# Patient Record
Sex: Female | Born: 1985 | Hispanic: Yes | Marital: Single | Smoking: Never smoker
Health system: Southern US, Community
[De-identification: ages and names within clinical notes are randomized; demographics above are authoritative.]

## PROBLEM LIST (undated history)

## (undated) DIAGNOSIS — Z9049 Acquired absence of other specified parts of digestive tract: Secondary | ICD-10-CM

## (undated) HISTORY — PX: DILATION AND CURETTAGE OF UTERUS: SHX78

---

## 2005-01-15 ENCOUNTER — Inpatient Hospital Stay (HOSPITAL_COMMUNITY): Admission: AD | Admit: 2005-01-15 | Discharge: 2005-01-17 | Payer: Self-pay | Admitting: Obstetrics & Gynecology

## 2005-02-06 ENCOUNTER — Inpatient Hospital Stay (HOSPITAL_COMMUNITY): Admission: AD | Admit: 2005-02-06 | Discharge: 2005-02-06 | Payer: Self-pay | Admitting: Obstetrics & Gynecology

## 2006-10-14 ENCOUNTER — Other Ambulatory Visit: Admission: RE | Admit: 2006-10-14 | Discharge: 2006-10-14 | Payer: Self-pay | Admitting: Gynecology

## 2006-11-19 ENCOUNTER — Inpatient Hospital Stay (HOSPITAL_COMMUNITY): Admission: AD | Admit: 2006-11-19 | Discharge: 2006-11-20 | Payer: Self-pay | Admitting: Obstetrics and Gynecology

## 2006-11-20 ENCOUNTER — Encounter (INDEPENDENT_AMBULATORY_CARE_PROVIDER_SITE_OTHER): Payer: Self-pay | Admitting: Obstetrics and Gynecology

## 2007-08-03 ENCOUNTER — Emergency Department (HOSPITAL_COMMUNITY): Admission: EM | Admit: 2007-08-03 | Discharge: 2007-08-03 | Payer: Self-pay | Admitting: Emergency Medicine

## 2008-08-05 ENCOUNTER — Inpatient Hospital Stay (HOSPITAL_COMMUNITY): Admission: AD | Admit: 2008-08-05 | Discharge: 2008-08-05 | Payer: Self-pay | Admitting: Obstetrics and Gynecology

## 2008-10-18 ENCOUNTER — Inpatient Hospital Stay (HOSPITAL_COMMUNITY): Admission: AD | Admit: 2008-10-18 | Discharge: 2008-10-18 | Payer: Self-pay | Admitting: Obstetrics & Gynecology

## 2009-02-28 ENCOUNTER — Inpatient Hospital Stay (HOSPITAL_COMMUNITY): Admission: AD | Admit: 2009-02-28 | Discharge: 2009-03-02 | Payer: Self-pay | Admitting: Obstetrics & Gynecology

## 2009-02-28 ENCOUNTER — Encounter: Payer: Self-pay | Admitting: Obstetrics & Gynecology

## 2010-01-24 ENCOUNTER — Emergency Department (HOSPITAL_COMMUNITY): Admission: EM | Admit: 2010-01-24 | Discharge: 2010-01-24 | Payer: Self-pay | Admitting: Emergency Medicine

## 2010-01-30 ENCOUNTER — Emergency Department (HOSPITAL_COMMUNITY): Admission: EM | Admit: 2010-01-30 | Discharge: 2010-01-31 | Payer: Self-pay | Admitting: Emergency Medicine

## 2010-02-22 ENCOUNTER — Ambulatory Visit: Payer: Self-pay | Admitting: Nurse Practitioner

## 2010-02-22 ENCOUNTER — Inpatient Hospital Stay (HOSPITAL_COMMUNITY): Admission: AD | Admit: 2010-02-22 | Discharge: 2010-02-23 | Payer: Self-pay | Admitting: Family Medicine

## 2010-03-20 ENCOUNTER — Emergency Department (HOSPITAL_COMMUNITY): Admission: EM | Admit: 2010-03-20 | Discharge: 2010-01-03 | Payer: Self-pay | Admitting: Emergency Medicine

## 2010-04-12 ENCOUNTER — Ambulatory Visit (HOSPITAL_COMMUNITY)
Admission: AD | Admit: 2010-04-12 | Discharge: 2010-04-12 | Payer: Self-pay | Source: Home / Self Care | Attending: Obstetrics & Gynecology | Admitting: Obstetrics & Gynecology

## 2010-05-07 ENCOUNTER — Ambulatory Visit
Admission: RE | Admit: 2010-05-07 | Discharge: 2010-05-07 | Payer: Self-pay | Source: Home / Self Care | Attending: Obstetrics & Gynecology | Admitting: Obstetrics & Gynecology

## 2010-05-08 NOTE — Progress Notes (Unsigned)
NAME:  Miranda Sosa, Miranda Sosa NO.:  [id number]  MEDICAL RECORD NO.:  [id number]          PATIENT TYPE:  WOC  LOCATION:  [facility]                   FACILITY:  [facility]  PHYSICIAN:  Elsie Lincoln, MD      DATE OF BIRTH:  [birth year]  DATE OF SERVICE:                                 CLINIC NOTE  The patient is a 25 year old female who presents approximately 3 weeks to a month after a 10-week D and E.  The patient had presented to the MAU with a 10-week 2-day missed abortion.  The patient had an uncomplicated D and E.  Pathology reveals products of conception.  The patient is not having any problems.  The patient is having bleeding. She would like Depo-Provera.  She is not having sexual intercourse.  [facility].  She gets Pap smears there and she will return there as well for all of her health care maintenance.  PHYSICAL EXAMINATION:  VITAL SIGNS:  Temperature 97.5, pulse 82, blood pressure 107/77, weight 133.9. GENERAL:  Well nourished, well developed, no apparent stress. HEENT:  Normocephalic, atraumatic. CHEST:  Normal movement. ABDOMEN:  Soft, nontender. PELVIC:  Genitalia, Tanner V.  Vagina pink, normal rugae.  Cervix closed, nontender.  Uterus retroverted, nontender.  Adnexa; no masses, nontender.  Both ovaries felt. EXTREMITIES:  No edema.  ASSESSMENT AND PLAN:  A 25 year old female status post D and E for 10- week missed abortion. 1. The patient is doing well, exam normal. 2. Depo-Provera. 3. [facility].          ______________________________ Elsie Lincoln, MD    KL/MEDQ  D:  05/07/2010  T:  05/08/2010  Job:  [id number]

## 2010-05-14 DEATH — deceased

## 2010-06-23 LAB — CBC
HCT: 36.5 % (ref 36.0–46.0)
Hemoglobin: 12.5 g/dL (ref 12.0–15.0)
MCV: 85.1 fL (ref 78.0–100.0)
RBC: 4.29 MIL/uL (ref 3.87–5.11)
WBC: 8.4 10*3/uL (ref 4.0–10.5)

## 2010-06-24 LAB — COMPREHENSIVE METABOLIC PANEL
ALT: 33 U/L (ref 0–35)
AST: 20 U/L (ref 0–37)
Alkaline Phosphatase: 64 U/L (ref 39–117)
CO2: 23 mEq/L (ref 19–32)
Calcium: 8.8 mg/dL (ref 8.4–10.5)
Chloride: 104 mEq/L (ref 96–112)
GFR calc Af Amer: >60 mL/min (ref >60)
GFR calc non Af Amer: >60 mL/min (ref >60)
Glucose, Bld: 96 mg/dL (ref 70–99)
Sodium: 136 mEq/L (ref 135–145)
Total Bilirubin: 0.2 mg/dL — ABNORMAL LOW (ref 0.3–1.2)

## 2010-06-24 LAB — URINALYSIS, ROUTINE W REFLEX MICROSCOPIC
Bilirubin Urine: NEGATIVE
Nitrite: NEGATIVE
Specific Gravity, Urine: 1.03 (ref 1.005–1.030)
pH: 6 (ref 5.0–8.0)

## 2010-06-24 LAB — URINE MICROSCOPIC-ADD ON

## 2010-06-24 LAB — GC/CHLAMYDIA PROBE AMP, GENITAL
Chlamydia, DNA Probe: NEGATIVE
GC Probe Amp, Genital: NEGATIVE

## 2010-06-24 LAB — HCG, QUANTITATIVE, PREGNANCY: hCG, Beta Chain, Quant, S: 45202 m[IU]/mL — ABNORMAL HIGH (ref <5)

## 2010-06-24 LAB — URINE CULTURE

## 2010-06-24 LAB — CBC
HCT: 34.7 % — ABNORMAL LOW (ref 36.0–46.0)
Hemoglobin: 11.8 g/dL — ABNORMAL LOW (ref 12.0–15.0)
MCHC: 34 g/dL (ref 30.0–36.0)
RBC: 3.92 MIL/uL (ref 3.87–5.11)

## 2010-06-24 LAB — WET PREP, GENITAL

## 2010-06-25 LAB — URINALYSIS, ROUTINE W REFLEX MICROSCOPIC
Ketones, ur: NEGATIVE mg/dL
Nitrite: NEGATIVE
Protein, ur: NEGATIVE mg/dL
Urobilinogen, UA: 1 mg/dL (ref 0.0–1.0)
pH: 6 (ref 5.0–8.0)

## 2010-06-25 LAB — DIFFERENTIAL
Basophils Absolute: 0 10*3/uL (ref 0.0–0.1)
Eosinophils Absolute: 0.1 10*3/uL (ref 0.0–0.7)
Eosinophils Relative: 2 % (ref 0–5)
Lymphocytes Relative: 27 % (ref 12–46)

## 2010-06-25 LAB — COMPREHENSIVE METABOLIC PANEL
AST: 37 U/L (ref 0–37)
CO2: 23 mEq/L (ref 19–32)
Chloride: 109 mEq/L (ref 96–112)
Creatinine, Ser: 0.6 mg/dL (ref 0.4–1.2)
GFR calc Af Amer: >60 mL/min (ref >60)
GFR calc non Af Amer: >60 mL/min (ref >60)
Total Bilirubin: 0.5 mg/dL (ref 0.3–1.2)

## 2010-06-25 LAB — CBC
HCT: 38.2 % (ref 36.0–46.0)
Hemoglobin: 12.9 g/dL (ref 12.0–15.0)
MCHC: 33.8 g/dL (ref 30.0–36.0)
RBC: 4.5 MIL/uL (ref 3.87–5.11)
WBC: 8.2 10*3/uL (ref 4.0–10.5)

## 2010-06-25 LAB — POCT I-STAT, CHEM 8
HCT: 40 % (ref 36.0–46.0)
Hemoglobin: 13.6 g/dL (ref 12.0–15.0)
Potassium: 4 mEq/L (ref 3.5–5.1)
Sodium: 140 mEq/L (ref 135–145)

## 2010-06-25 LAB — URINE CULTURE: Colony Count: 8000

## 2010-06-26 ENCOUNTER — Other Ambulatory Visit: Payer: Self-pay | Admitting: Surgery

## 2010-06-26 ENCOUNTER — Encounter (HOSPITAL_COMMUNITY): Payer: Self-pay | Attending: Surgery

## 2010-06-26 DIAGNOSIS — Z01812 Encounter for preprocedural laboratory examination: Secondary | ICD-10-CM | POA: Insufficient documentation

## 2010-06-26 LAB — COMPREHENSIVE METABOLIC PANEL
ALT: 91 U/L — ABNORMAL HIGH (ref 0–35)
ALT: 26 U/L (ref 0–35)
AST: 49 U/L — ABNORMAL HIGH (ref 0–37)
AST: 33 U/L (ref 0–37)
Albumin: 4.7 g/dL (ref 3.5–5.2)
Albumin: 4.4 g/dL (ref 3.5–5.2)
Alkaline Phosphatase: 106 U/L (ref 39–117)
Alkaline Phosphatase: 95 U/L (ref 39–117)
BUN: 16 mg/dL (ref 6–23)
CO2: 27 mEq/L (ref 19–32)
Chloride: 106 mEq/L (ref 96–112)
Chloride: 105 mEq/L (ref 96–112)
Chloride: 109 mEq/L (ref 96–112)
Creatinine, Ser: 0.67 mg/dL (ref 0.4–1.2)
GFR calc Af Amer: >60 mL/min (ref >60)
GFR calc Af Amer: >60 mL/min (ref >60)
GFR calc non Af Amer: >60 mL/min (ref >60)
Glucose, Bld: 99 mg/dL (ref 70–99)
Glucose, Bld: 113 mg/dL — ABNORMAL HIGH (ref 70–99)
Potassium: 3.5 mEq/L (ref 3.5–5.1)
Potassium: 3.7 mEq/L (ref 3.5–5.1)
Potassium: 3.5 mEq/L (ref 3.5–5.1)
Sodium: 140 mEq/L (ref 135–145)
Sodium: 141 mEq/L (ref 135–145)
Total Bilirubin: 0.3 mg/dL (ref 0.3–1.2)
Total Bilirubin: 0.5 mg/dL (ref 0.3–1.2)
Total Bilirubin: 0.3 mg/dL (ref 0.3–1.2)
Total Protein: 7.9 g/dL (ref 6.0–8.3)
Total Protein: 7.3 g/dL (ref 6.0–8.3)

## 2010-06-26 LAB — URINALYSIS, ROUTINE W REFLEX MICROSCOPIC
Bilirubin Urine: NEGATIVE
Bilirubin Urine: NEGATIVE
Glucose, UA: NEGATIVE mg/dL
Hgb urine dipstick: NEGATIVE
Hgb urine dipstick: NEGATIVE
Ketones, ur: NEGATIVE mg/dL
Nitrite: NEGATIVE
Specific Gravity, Urine: 1.027 (ref 1.005–1.030)
Specific Gravity, Urine: 1.025 (ref 1.005–1.030)
Urobilinogen, UA: 1 mg/dL (ref 0.0–1.0)
pH: 6 (ref 5.0–8.0)
pH: 5 (ref 5.0–8.0)

## 2010-06-26 LAB — DIFFERENTIAL
Basophils Absolute: 0 10*3/uL (ref 0.0–0.1)
Basophils Absolute: 0 10*3/uL (ref 0.0–0.1)
Basophils Relative: 0 % (ref 0–1)
Eosinophils Absolute: 0.2 10*3/uL (ref 0.0–0.7)
Eosinophils Relative: 2 % (ref 0–5)
Eosinophils Relative: 2 % (ref 0–5)
Eosinophils Relative: 2 % (ref 0–5)
Lymphocytes Relative: 46 % (ref 12–46)
Lymphocytes Relative: 37 % (ref 12–46)
Lymphs Abs: 3.1 10*3/uL (ref 0.7–4.0)
Lymphs Abs: 4.6 10*3/uL — ABNORMAL HIGH (ref 0.7–4.0)
Monocytes Absolute: 0.6 10*3/uL (ref 0.1–1.0)
Monocytes Absolute: 0.6 10*3/uL (ref 0.1–1.0)
Monocytes Relative: 6 % (ref 3–12)
Neutro Abs: 4.6 10*3/uL (ref 1.7–7.7)
Neutrophils Relative %: 52 % (ref 43–77)

## 2010-06-26 LAB — CBC
HCT: 35.4 % — ABNORMAL LOW (ref 36.0–46.0)
HCT: 35.5 % — ABNORMAL LOW (ref 36.0–46.0)
Hemoglobin: 11.6 g/dL — ABNORMAL LOW (ref 12.0–15.0)
Hemoglobin: 11.7 g/dL — ABNORMAL LOW (ref 12.0–15.0)
MCH: 28.5 pg (ref 26.0–34.0)
MCV: 89.4 fL (ref 78.0–100.0)
Platelets: 273 10*3/uL (ref 150–400)
RBC: 4.49 MIL/uL (ref 3.87–5.11)
RBC: 3.96 MIL/uL (ref 3.87–5.11)
RBC: 4.18 MIL/uL (ref 3.87–5.11)
WBC: 10.1 10*3/uL (ref 4.0–10.5)
WBC: 8.4 10*3/uL (ref 4.0–10.5)

## 2010-06-26 LAB — SURGICAL PCR SCREEN
MRSA, PCR: NEGATIVE
Staphylococcus aureus: NEGATIVE

## 2010-06-26 LAB — URINE MICROSCOPIC-ADD ON

## 2010-06-26 LAB — LIPASE, BLOOD: Lipase: 25 U/L (ref 11–59)

## 2010-06-26 LAB — POCT PREGNANCY, URINE: Preg Test, Ur: NEGATIVE

## 2010-06-30 ENCOUNTER — Other Ambulatory Visit: Payer: Self-pay | Admitting: Surgery

## 2010-06-30 ENCOUNTER — Ambulatory Visit (HOSPITAL_COMMUNITY)
Admission: RE | Admit: 2010-06-30 | Discharge: 2010-06-30 | Disposition: A | Discharge status: Home / Self Care | Payer: Self-pay | Source: Ambulatory Visit | Attending: Surgery | Admitting: Surgery

## 2010-06-30 ENCOUNTER — Other Ambulatory Visit (HOSPITAL_COMMUNITY): Payer: Self-pay | Admitting: Surgery

## 2010-06-30 DIAGNOSIS — K8010 Calculus of gallbladder with chronic cholecystitis without obstruction: Secondary | ICD-10-CM | POA: Insufficient documentation

## 2010-06-30 DIAGNOSIS — K8020 Calculus of gallbladder without cholecystitis without obstruction: Secondary | ICD-10-CM

## 2010-07-01 NOTE — Op Note (Signed)
Miranda Sosa, Miranda Sosa       ACCOUNT NO.:  [id number]  MEDICAL RECORD NO.:  [id number]           PATIENT TYPE:  O  LOCATION:  XRAY                         FACILITY:  [facility]  PHYSICIAN:  Wilmon Arms. Corliss Skains, M.D. DATE OF BIRTH:  02-28-1986  DATE OF PROCEDURE:  06/30/2010 DATE OF DISCHARGE:                              OPERATIVE REPORT   PREOPERATIVE DIAGNOSIS:  Chronic calculus cholecystitis.  POSTOPERATIVE DIAGNOSIS:  Chronic calculus cholecystitis.  PROCEDURE PERFORMED:  Laparoscopic cholecystectomy with intraoperative cholangiogram.  SURGEON:  Wilmon Arms. Ilya Neely, M.D.  ANESTHESIA:  General.  INDICATIONS:  This is a 24 year old female who was diagnosed with symptomatic gallstones during her pregnancy last year.  Unfortunately, she suffered a miscarriage and underwent D and E in December.  Her symptoms have persisted and she presents now for cholecystectomy.  An ultrasound has confirmed gallstones.  Liver function tests were normal. She presents now for cholecystectomy.  DESCRIPTION OF PROCEDURE:  The patient was brought to the operating room, placed in supine position on the operating room table.  After adequate level of general anesthesia was obtained, the patient's abdomen was prepped with ChloraPrep and draped in sterile fashion.  A time-out was taken to assure the proper patient, proper procedure.  We infiltrated the area below the umbilicus with 0.25% Marcaine with epinephrine.  A transverse incision was made.  Dissection was carried down the fascia.  The fascia was opened vertically.  We entered into the peritoneal cavity bluntly.  A stay suture of 0 Vicryl was placed around the fascial opening.  A Hasson cannula was inserted and secured with stay suture.  Pneumoperitoneum was obtained by insufflating CO2 and maintaining maximal pressure of 15 mmHg.  The laparoscope was inserted and the patient was positioned reverse Trendelenburg to her left.  An 11 mm port was  placed in the subxiphoid position.  Two 5 mm ports were placed in the right upper quadrant.  The gallbladder was grasped with a clamp and lifted.  There were minimal adhesions to the gallbladder.  We opened the peritoneum around the hilum of the gallbladder.  We dissected around the cystic artery and cystic duct.  The cystic artery was quite large and was anterior to the cystic duct.  We ligated the cystic artery with the clips and divided.  The cystic duct was ligated and clipped distally.  A small opening was created on cystic duct.  A Cook cholangiogram catheter was inserted through the stab incision, threaded in the cystic duct.  Cholangiogram was then obtained, which showed good flow proximally and distally in biliary tree with no sign of filling defect.  Contrast flowed easily into duodenum.  The catheter was removed.  The cystic duct was ligated with clips and divided. Electrocautery was used to dissect the gallbladder free from the liver. The gallbladder was placed in EndoCatch sac.  We irrigated the gallbladder fossa and inspected for hemostasis.  The gallbladder and EndoCatch sac were removed through umbilical port site.  Pursestring sutures were used to close the umbilicus.  We inspected carefully for hemostasis.  Pneumoperitoneum was then released as we removed the trocars.  A 4-0 Monocryl was used to close skin incisions.  Steri-Strips and clean dressings were applied.  The patient was extubated and brought to recovery room in stable condition.  All sponge, instrument, needle counts were correct.     Wilmon Arms. Corliss Skains, M.D.     MKT/MEDQ  D:  06/30/2010  T:  07/01/2010  Job:  [id number]  Electronically Signed by Manus Rudd M.D. on 07/01/2010 10:36:54 AM

## 2010-07-16 LAB — CBC
Hemoglobin: 11.3 g/dL — ABNORMAL LOW (ref 12.0–15.0)
MCHC: 33.5 g/dL (ref 30.0–36.0)
Platelets: 142 10*3/uL — ABNORMAL LOW (ref 150–400)
Platelets: 175 10*3/uL (ref 150–400)
RBC: 3.42 MIL/uL — ABNORMAL LOW (ref 3.87–5.11)
RDW: 14.6 % (ref 11.5–15.5)
WBC: 8.2 10*3/uL (ref 4.0–10.5)

## 2010-07-16 LAB — RPR: RPR Ser Ql: NONREACTIVE

## 2010-07-20 LAB — CBC
Hemoglobin: 10.5 g/dL — ABNORMAL LOW (ref 12.0–15.0)
RBC: 3.31 MIL/uL — ABNORMAL LOW (ref 3.87–5.11)
RDW: 14.3 % (ref 11.5–15.5)
WBC: 9.3 10*3/uL (ref 4.0–10.5)

## 2010-07-20 LAB — WET PREP, GENITAL: Clue Cells Wet Prep HPF POC: NONE SEEN

## 2010-07-20 LAB — COMPREHENSIVE METABOLIC PANEL
ALT: 15 U/L (ref 0–35)
Alkaline Phosphatase: 58 U/L (ref 39–117)
Chloride: 106 mEq/L (ref 96–112)
Glucose, Bld: 87 mg/dL (ref 70–99)
Potassium: 3.5 mEq/L (ref 3.5–5.1)
Sodium: 136 mEq/L (ref 135–145)
Total Bilirubin: 0.3 mg/dL (ref 0.3–1.2)
Total Protein: 6 g/dL (ref 6.0–8.3)

## 2010-07-20 LAB — URINALYSIS, ROUTINE W REFLEX MICROSCOPIC
Glucose, UA: NEGATIVE mg/dL
Ketones, ur: NEGATIVE mg/dL
Nitrite: NEGATIVE
Specific Gravity, Urine: 1.025 (ref 1.005–1.030)
pH: 6.5 (ref 5.0–8.0)

## 2010-07-20 LAB — GC/CHLAMYDIA PROBE AMP, GENITAL: GC Probe Amp, Genital: NEGATIVE

## 2010-07-23 LAB — URINALYSIS, ROUTINE W REFLEX MICROSCOPIC
Nitrite: NEGATIVE
Specific Gravity, Urine: 1.01 (ref 1.005–1.030)
pH: 6.5 (ref 5.0–8.0)

## 2010-07-23 LAB — ABO/RH: ABO/RH(D): O POS

## 2010-07-23 LAB — CBC
Hemoglobin: 11.7 g/dL — ABNORMAL LOW (ref 12.0–15.0)
MCHC: 34.7 g/dL (ref 30.0–36.0)
RBC: 3.74 MIL/uL — ABNORMAL LOW (ref 3.87–5.11)

## 2011-01-26 LAB — GC/CHLAMYDIA PROBE AMP, GENITAL
Chlamydia, DNA Probe: POSITIVE — AB
GC Probe Amp, Genital: NEGATIVE

## 2011-01-26 LAB — SYPHILIS: RPR W/REFLEX TO RPR TITER AND TREPONEMAL ANTIBODIES, TRADITIONAL SCREENING AND DIAGNOSIS ALGORITHM: RPR Ser Ql: NONREACTIVE

## 2011-01-26 LAB — CBC
HCT: 33.2 — ABNORMAL LOW
Hemoglobin: 11.1 — ABNORMAL LOW
MCHC: 33.4
MCV: 87.2
Platelets: 239
RBC: 3.81 — ABNORMAL LOW
RDW: 13.7
WBC: 11.8 — ABNORMAL HIGH

## 2011-01-26 LAB — URINALYSIS, ROUTINE W REFLEX MICROSCOPIC
Bilirubin Urine: NEGATIVE
Glucose, UA: 250 — AB
Ketones, ur: NEGATIVE
Nitrite: NEGATIVE
Protein, ur: NEGATIVE
Specific Gravity, Urine: <1.005 — ABNORMAL LOW
Urobilinogen, UA: 0.2
pH: 7

## 2011-01-26 LAB — HCG, QUANTITATIVE, PREGNANCY

## 2011-01-26 LAB — ABO/RH: ABO/RH(D): O POS

## 2011-01-26 LAB — URINE MICROSCOPIC-ADD ON

## 2012-03-16 IMAGING — US US ABDOMEN COMPLETE
1 series · 13 of 25 positions shown · non-contrast
Comparison: Abdominal ultrasound performed 10/18/2008

CLINICAL DATA: Abdominal pain.

ABDOMINAL ULTRASOUND COMPLETE

[Series 1: us abdomen complete · 0.30mm/px · 13 of 51 slices shown]
[im 1/51]
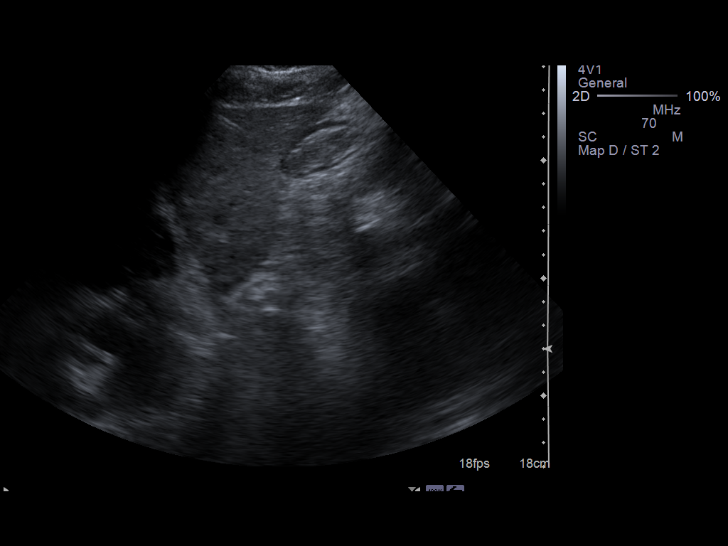
[im 5/51]
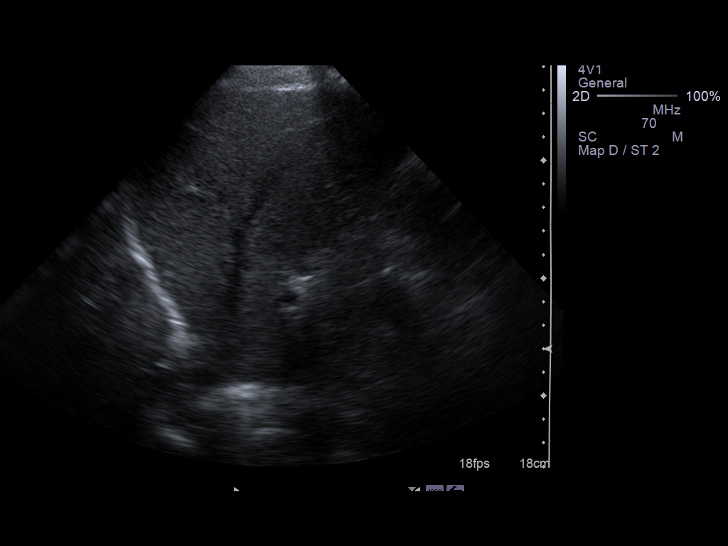
[im 9/51]
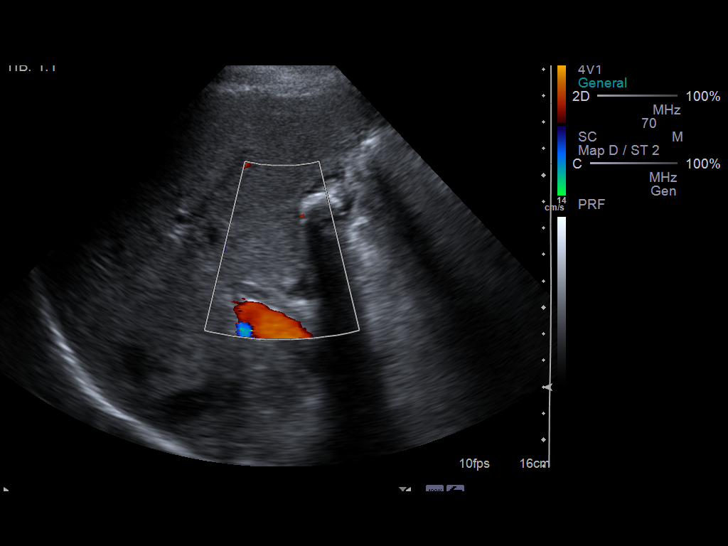
[im 13/51]
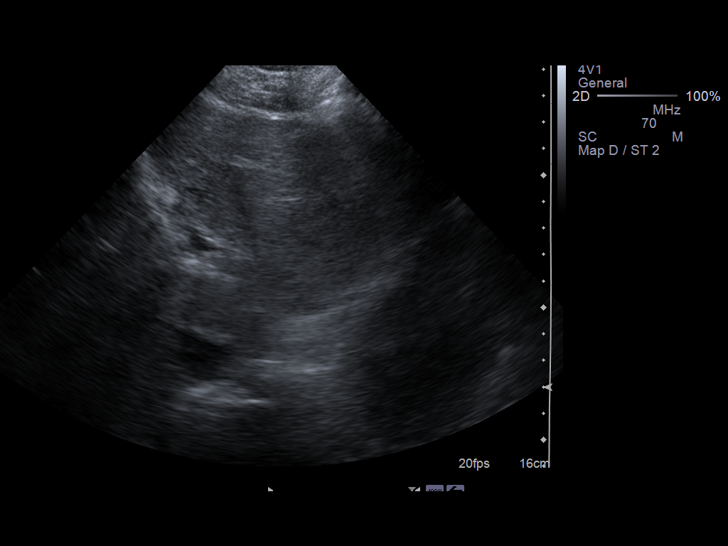
[im 17/51]
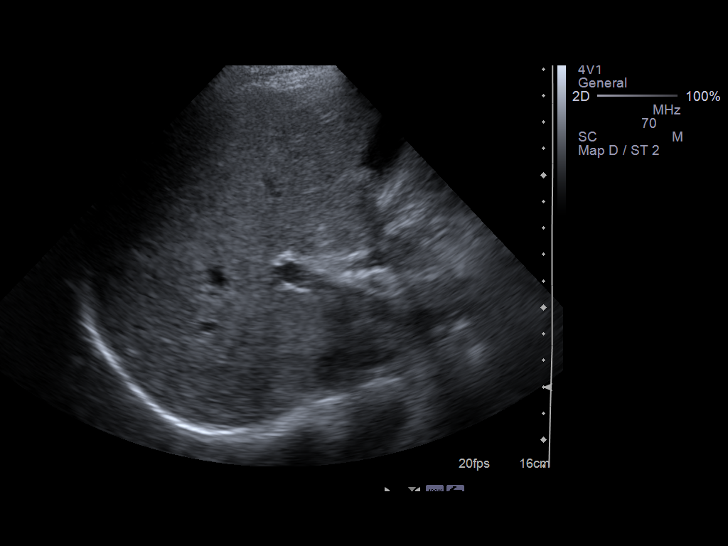
[im 21/51]
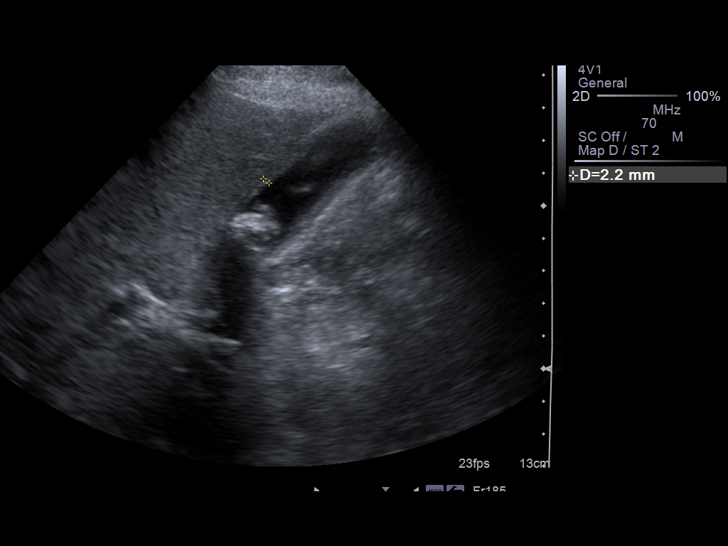
[im 26/51]
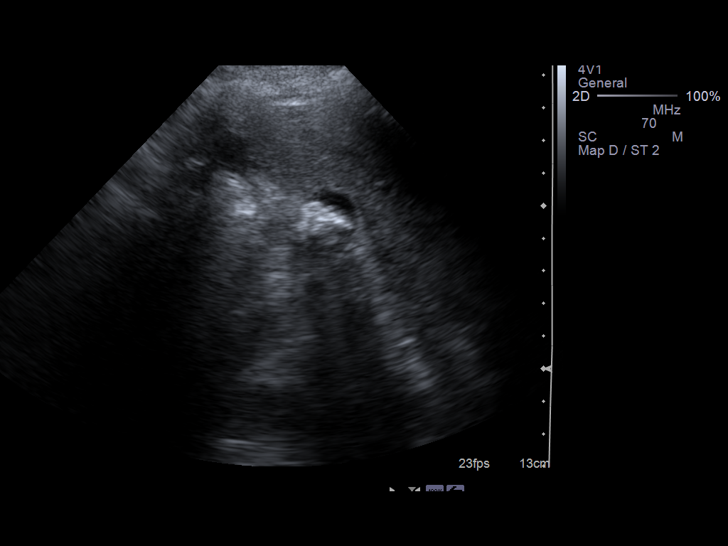
[im 30/51]
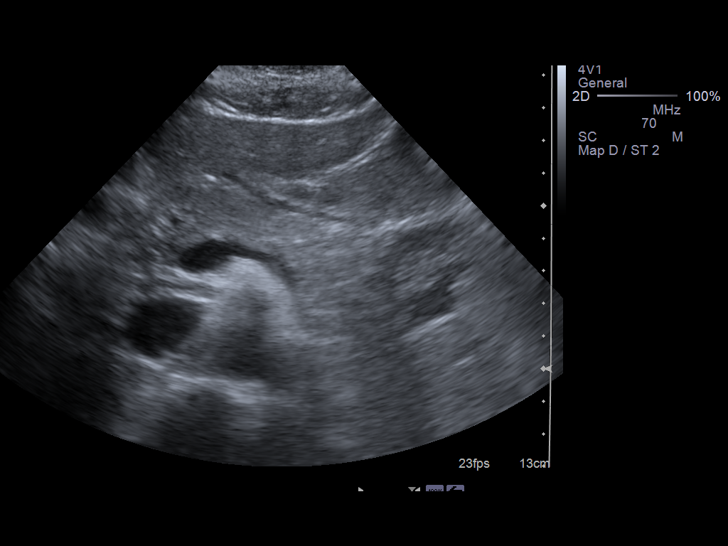
[im 34/51]
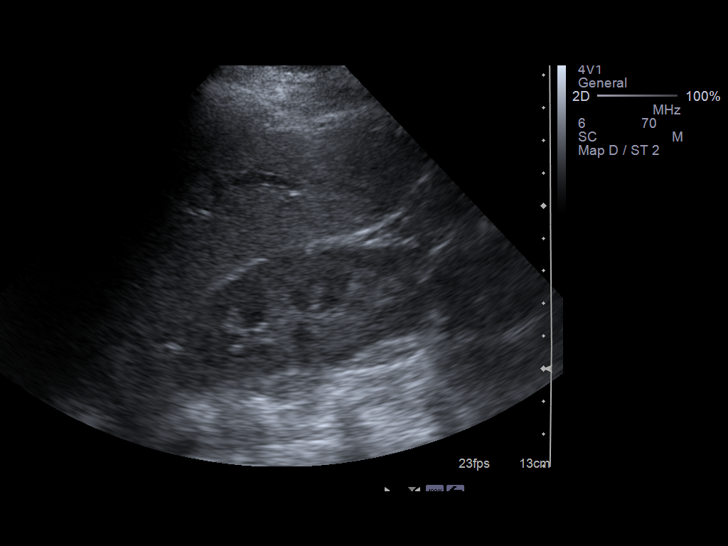
[im 38/51]
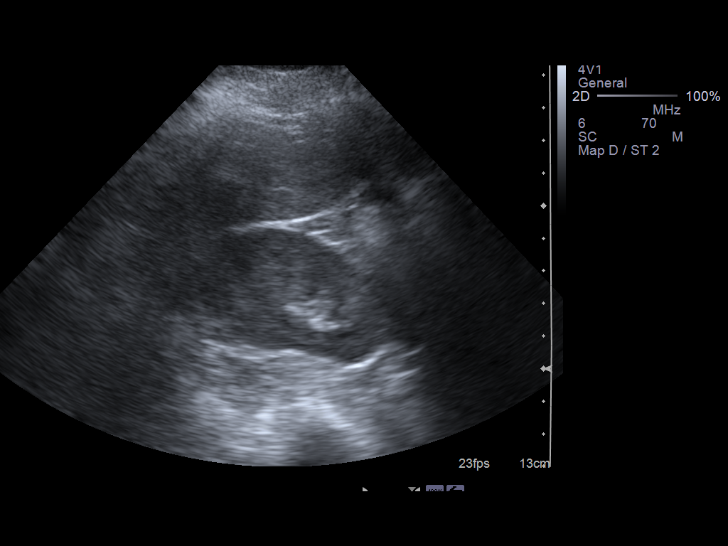
[im 42/51]
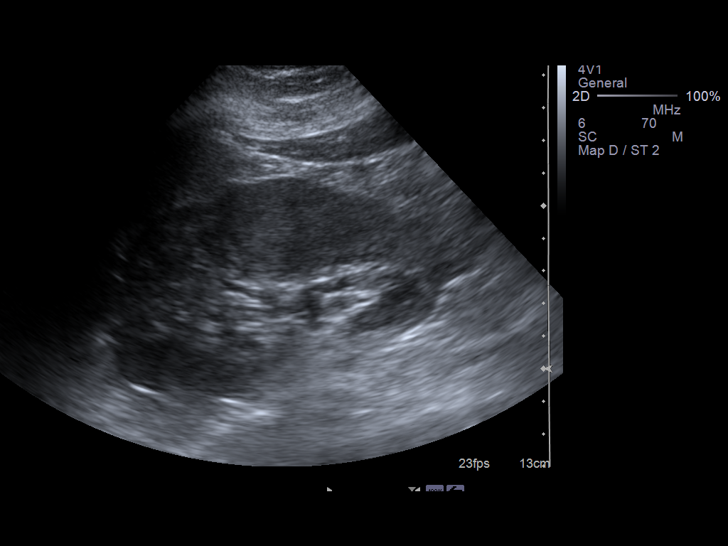
[im 46/51]
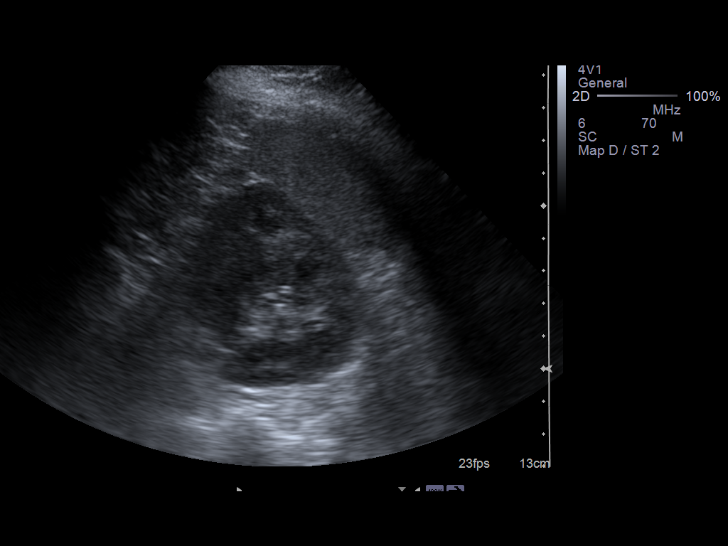
[im 51/51]
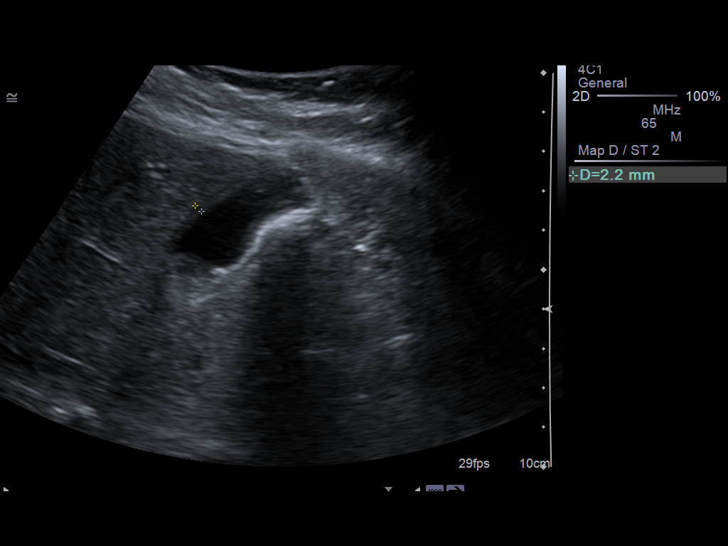

[13 of 25 positions shown; findings below may reference images not displayed]

FINDINGS: Gallbladder:  Multiple stones are seen within the gallbladder, the
largest of which measures 1.9 cm, lodged at the base and neck of
the gallbladder.  This largest stone was also present on the prior
study.  There is no evidence of gallbladder distension to suggest
obstruction; no pericholecystic fluid or gallbladder wall
thickening is seen.  No ultrasonographic Murphy's sign is elicited.

Common Bile Duct:  0.4 cm in diameter; within normal limits in
caliber.

Liver:  Normal parenchymal echogenicity and echotexture; no focal
lesions identified.  Limited Doppler evaluation demonstrates normal
blood flow within the liver.

IVC:  Unremarkable in appearance.

Pancreas:  Although the pancreas is difficult to visualize in its
entirety due to overlying bowel gas, no focal pancreatic
abnormality is identified.

Spleen:  9.6 cm in length; within normal limits in size and
echotexture.

Right kidney:  10.2 cm in length; normal in size, configuration and
parenchymal echogenicity.  No evidence of mass or hydronephrosis.

Left kidney:  11.0 cm in length; normal in size, configuration and
parenchymal echogenicity.  No evidence of mass or hydronephrosis.

Abdominal Aorta:  Normal in caliber; no aneurysm identified.
IMPRESSION: 1.  Cholelithiasis; 1.9 cm stone again seen lodged at the base and
neck of the gallbladder.  No evidence of obstruction or
cholecystitis.
2.  Otherwise unremarkable abdominal ultrasound.

## 2012-06-23 IMAGING — US US OB COMP LESS 14 WK
1 series · 14 of 25 positions shown · non-contrast
Comparison: none

[Series 1: us ob comp less 14 wks · 25 acquisitions, 14 frames shown]
[im 1/25]
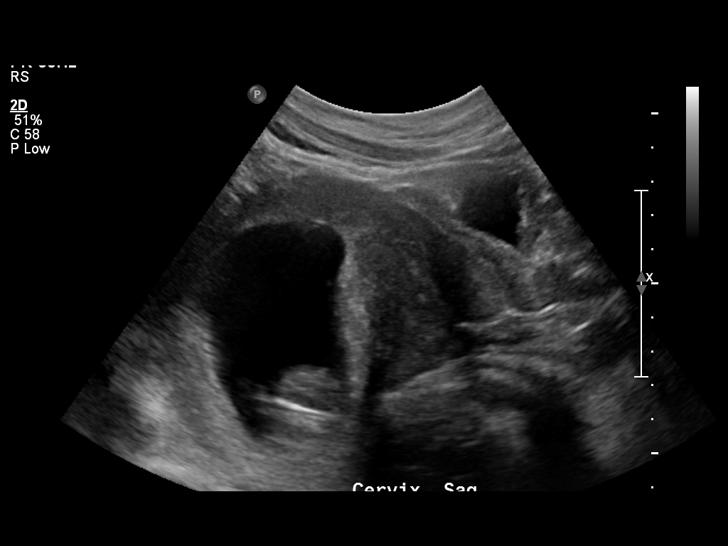
[im 3/25]
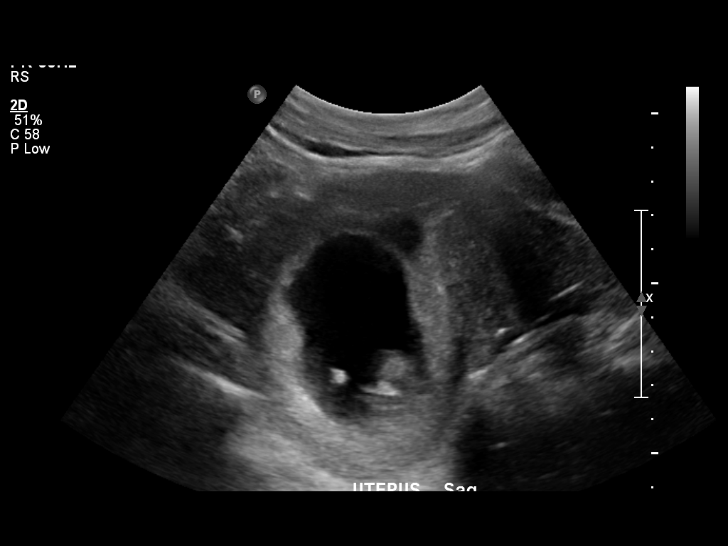
[im 5/25]
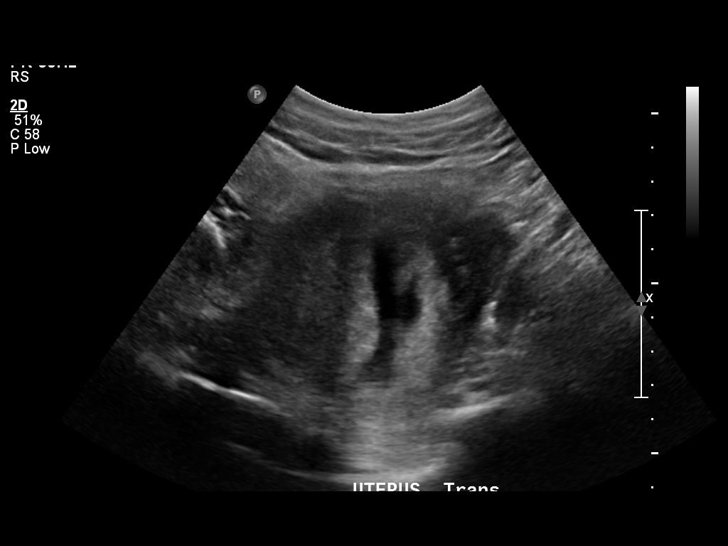
[im 7/25]
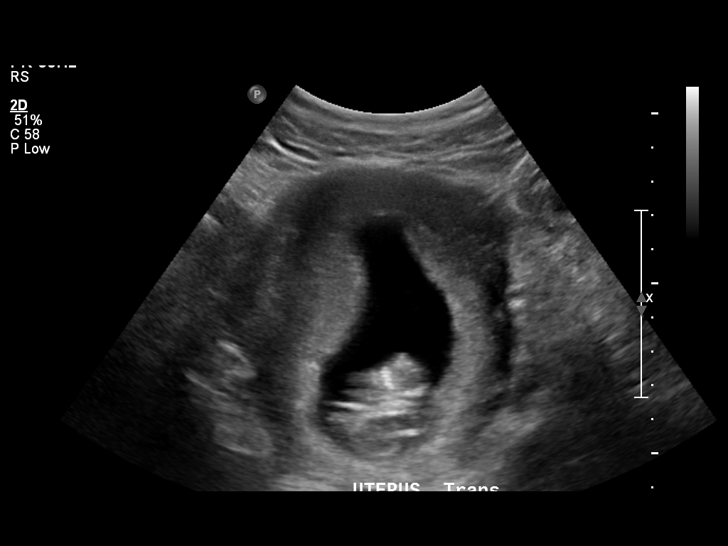
[im 9/25]
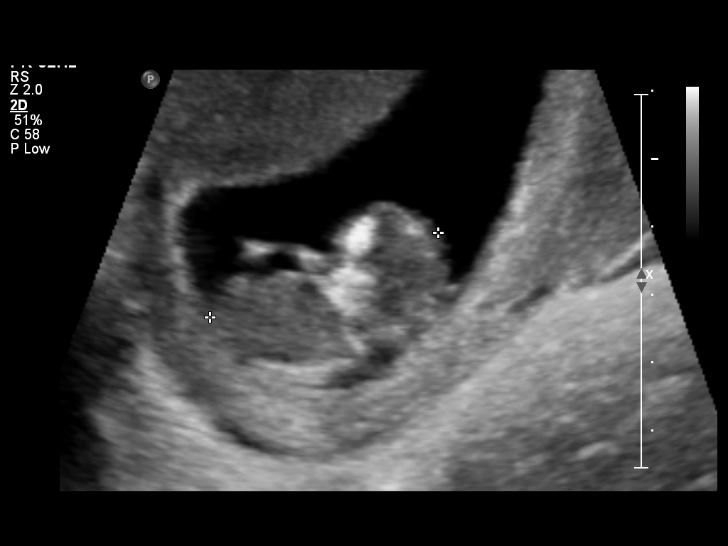
[im 10/25]
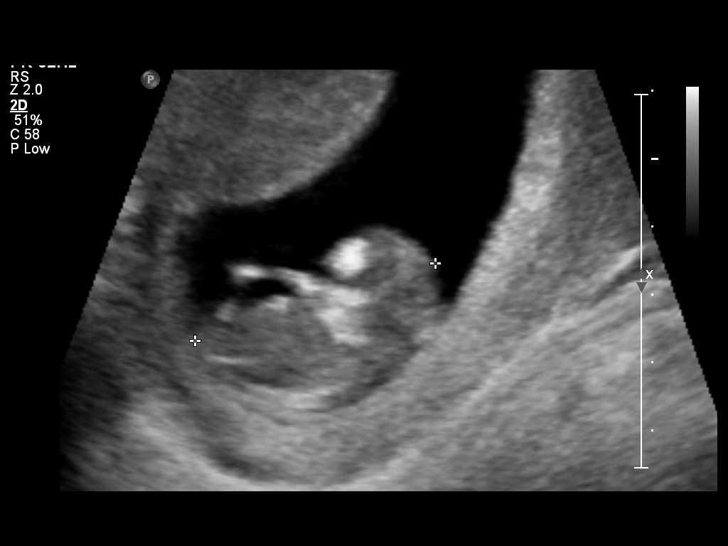
[im 12/25]
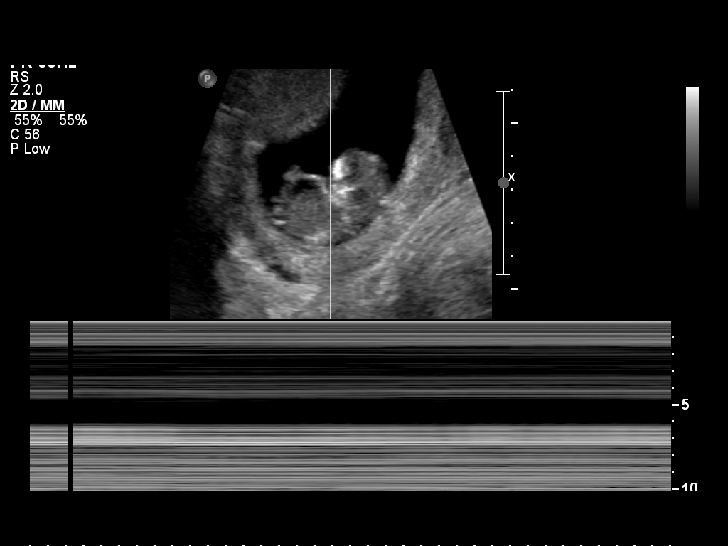
[im 14/25]
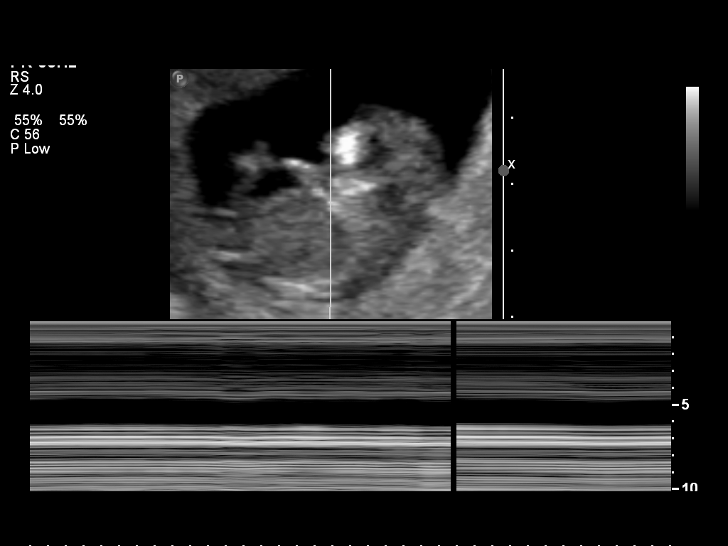
[im 16/25]
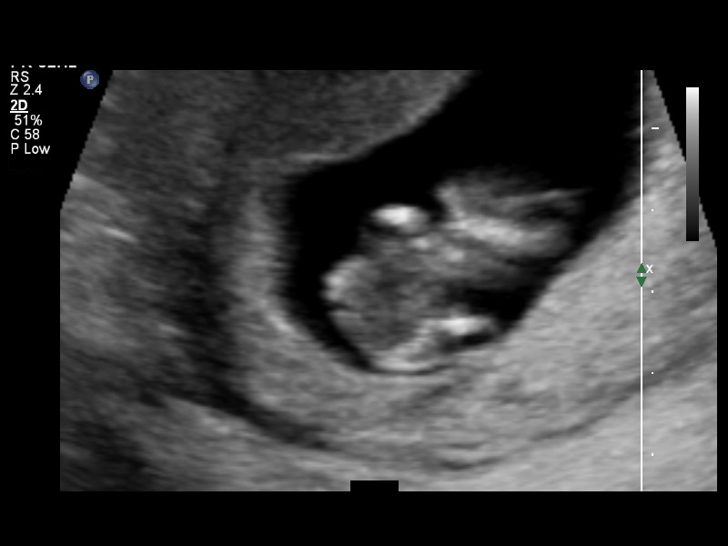
[im 17/25]
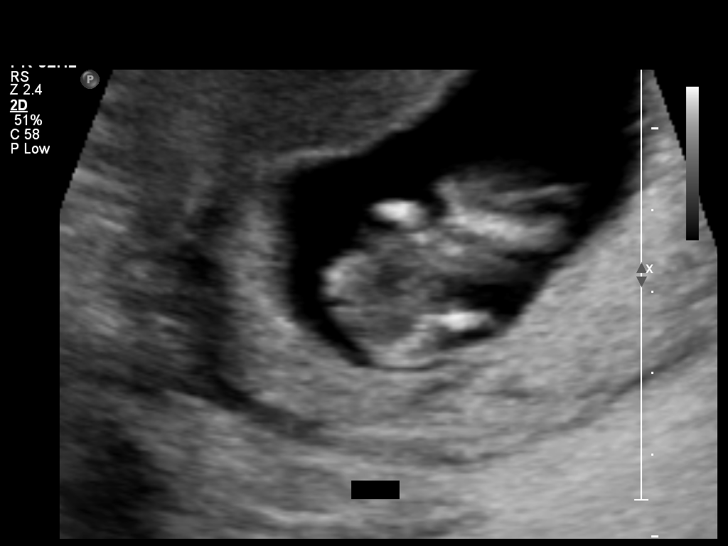
[im 19/25]
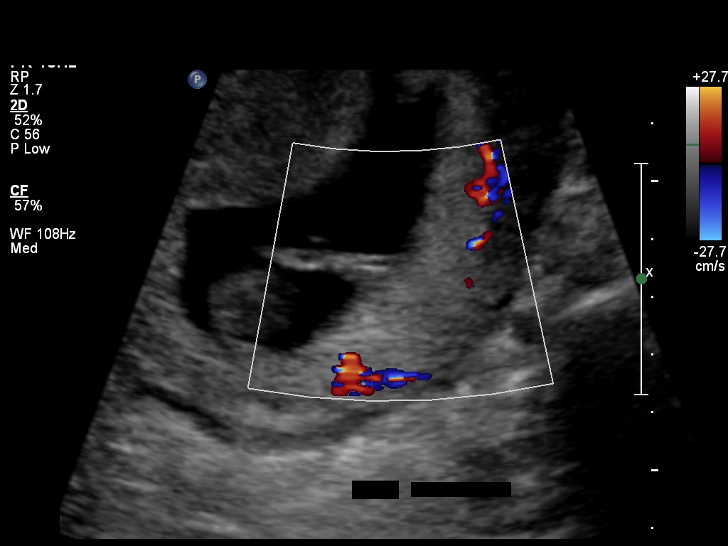
[im 21/25]
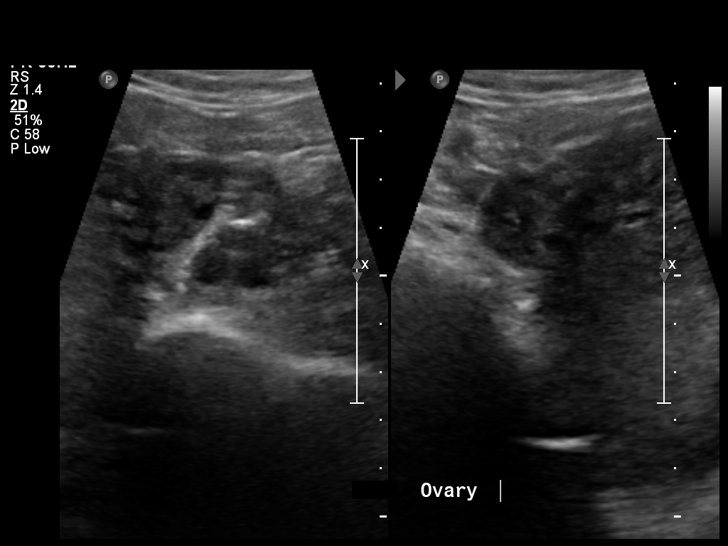
[im 23/25]
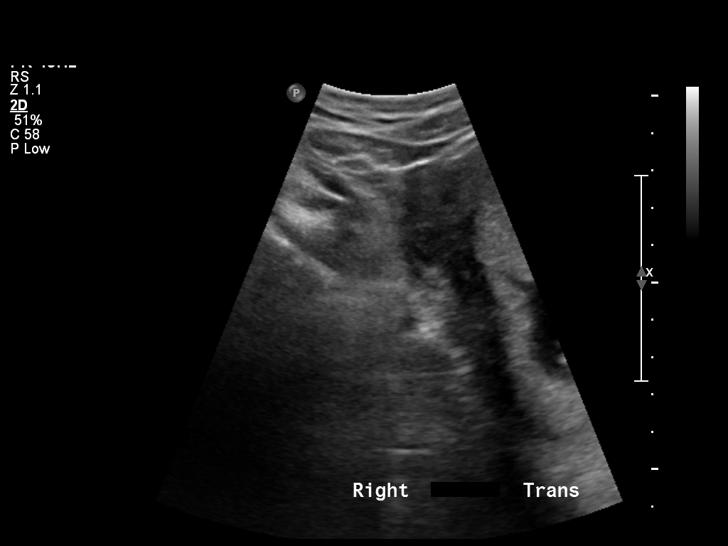
[im 25/25]
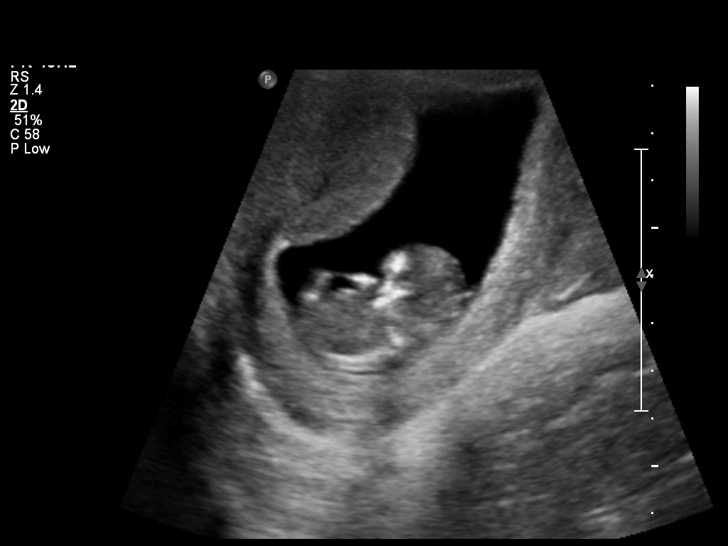

[14 of 25 positions shown; findings below may reference images not displayed]

OBSTETRICS REPORT
                      (Signed Final 04/12/2010 [DATE])

           ARECHIGA

Procedures

 US OB COMP LESS 14 WKS                                76801.0
Indications

 Vaginal bleeding, unknown etiology
 No fetal cardiac activity detected;  assess viability
Fetal Evaluation

 Gest. Sac:         Intrauterine
 Yolk Sac:          Not visualized
 Fetal Pole:        Visualized
 Cardiac Activity:  Absent

 Amniotic Fluid
 AFI FV:      Subjectively within normal limits
Biometry

 CRL:     36.3  mm     G. Age:  10w 2d                 EDD:    11/06/10
Gestational Age

 Best:          13w 6d     Det. By:  Previous Ultrasound      EDD:   10/12/10
                                     (02/23/10)
Cervix Uterus Adnexa

 Cervix:       Normal appearance by transabdominal scan.
 Uterus:       No abnormality visualized.
 Cul De Sac:   No free fluid seen.
 Left Ovary:    Within normal limits.
 Right Ovary:   Not visualized.

 Adnexa:     No abnormality visualized.
Impression

 84w0d intrauterine fetal demise.

## 2013-04-13 NOTE — L&D Delivery Note (Signed)
Delivery Note At 4:00 PM a viable female was delivered via Vaginal, Spontaneous Delivery (Presentation: ;  ).  APGAR: , ; weight  .   Placenta status: Intact, Spontaneous.  Cord: 3 vessels with the following complications: None.  Cord pH: not obtained  Anesthesia:  None Episiotomy:  None Lacerations:  2nd Suture Repair: 3.0 vicryl rapide Est. Blood Loss (mL):  300 cc  Mom to postpartum.  Baby to Couplet care / Skin to Skin.  Elita BooneRoberts, Maxie Debose C 02/19/2014, 4:26 PM

## 2013-10-19 ENCOUNTER — Other Ambulatory Visit (HOSPITAL_COMMUNITY): Payer: Self-pay | Admitting: Nurse Practitioner

## 2013-10-19 DIAGNOSIS — Z3689 Encounter for other specified antenatal screening: Secondary | ICD-10-CM

## 2013-10-19 LAB — OB RESULTS CONSOLE ANTIBODY SCREEN: Antibody Screen: NEGATIVE

## 2013-10-19 LAB — OB RESULTS CONSOLE GC/CHLAMYDIA: Chlamydia: NEGATIVE

## 2013-10-19 LAB — OB RESULTS CONSOLE RPR: RPR: NONREACTIVE

## 2013-10-19 LAB — OB RESULTS CONSOLE HEPATITIS B SURFACE ANTIGEN: Hepatitis B Surface Ag: NEGATIVE

## 2013-10-19 LAB — OB RESULTS CONSOLE HIV ANTIBODY (ROUTINE TESTING): HIV: NONREACTIVE

## 2013-10-19 LAB — OB RESULTS CONSOLE RUBELLA ANTIBODY, IGM: RUBELLA: IMMUNE

## 2013-10-23 ENCOUNTER — Ambulatory Visit (HOSPITAL_COMMUNITY)
Admission: RE | Admit: 2013-10-23 | Discharge: 2013-10-23 | Disposition: A | Discharge status: Home / Self Care | Payer: Medicaid Other | Source: Ambulatory Visit | Attending: Nurse Practitioner | Admitting: Nurse Practitioner

## 2013-10-23 DIAGNOSIS — Z3689 Encounter for other specified antenatal screening: Secondary | ICD-10-CM | POA: Diagnosis present

## 2013-11-16 ENCOUNTER — Other Ambulatory Visit (HOSPITAL_COMMUNITY): Payer: Self-pay | Admitting: Nurse Practitioner

## 2013-11-16 DIAGNOSIS — IMO0002 Reserved for concepts with insufficient information to code with codable children: Secondary | ICD-10-CM

## 2013-11-16 DIAGNOSIS — Z0489 Encounter for examination and observation for other specified reasons: Secondary | ICD-10-CM

## 2013-12-04 ENCOUNTER — Ambulatory Visit (HOSPITAL_COMMUNITY)
Admission: RE | Admit: 2013-12-04 | Discharge: 2013-12-04 | Disposition: A | Discharge status: Home / Self Care | Payer: Medicaid Other | Source: Ambulatory Visit | Attending: Nurse Practitioner | Admitting: Nurse Practitioner

## 2013-12-04 DIAGNOSIS — Z363 Encounter for antenatal screening for malformations: Secondary | ICD-10-CM | POA: Insufficient documentation

## 2013-12-04 DIAGNOSIS — Z0489 Encounter for examination and observation for other specified reasons: Secondary | ICD-10-CM

## 2013-12-04 DIAGNOSIS — Z1389 Encounter for screening for other disorder: Secondary | ICD-10-CM | POA: Diagnosis present

## 2013-12-04 DIAGNOSIS — O358XX0 Maternal care for other (suspected) fetal abnormality and damage, not applicable or unspecified: Secondary | ICD-10-CM

## 2013-12-04 DIAGNOSIS — IMO0002 Reserved for concepts with insufficient information to code with codable children: Secondary | ICD-10-CM

## 2014-01-22 LAB — OB RESULTS CONSOLE GBS: STREP GROUP B AG: NEGATIVE

## 2014-01-22 LAB — OB RESULTS CONSOLE GC/CHLAMYDIA
CHLAMYDIA, DNA PROBE: NEGATIVE
Gonorrhea: NEGATIVE

## 2014-02-19 ENCOUNTER — Inpatient Hospital Stay (HOSPITAL_COMMUNITY)
Admission: AD | Admit: 2014-02-19 | Discharge: 2014-02-21 | DRG: 775 | Disposition: A | Discharge status: Home / Self Care | Payer: Medicaid Other | Source: Ambulatory Visit | Attending: Family Medicine | Admitting: Family Medicine

## 2014-02-19 ENCOUNTER — Encounter (HOSPITAL_COMMUNITY): Payer: Self-pay

## 2014-02-19 DIAGNOSIS — IMO0001 Reserved for inherently not codable concepts without codable children: Secondary | ICD-10-CM

## 2014-02-19 DIAGNOSIS — Z3A41 41 weeks gestation of pregnancy: Secondary | ICD-10-CM

## 2014-02-19 DIAGNOSIS — O471 False labor at or after 37 completed weeks of gestation: Secondary | ICD-10-CM | POA: Diagnosis present

## 2014-02-19 DIAGNOSIS — O480 Post-term pregnancy: Secondary | ICD-10-CM

## 2014-02-19 HISTORY — DX: Acquired absence of other specified parts of digestive tract: Z90.49

## 2014-02-19 LAB — CBC
HCT: 35.4 % — ABNORMAL LOW (ref 36.0–46.0)
HEMOGLOBIN: 11.7 g/dL — AB (ref 12.0–15.0)
MCH: 28.5 pg (ref 26.0–34.0)
MCHC: 33.1 g/dL (ref 30.0–36.0)
MCV: 86.3 fL (ref 78.0–100.0)
PLATELETS: 212 10*3/uL (ref 150–400)
RBC: 4.1 MIL/uL (ref 3.87–5.11)
RDW: 14 % (ref 11.5–15.5)
WBC: 9.1 10*3/uL (ref 4.0–10.5)

## 2014-02-19 LAB — RPR

## 2014-02-19 LAB — TYPE AND SCREEN
ABO/RH(D): O POS
Antibody Screen: NEGATIVE

## 2014-02-19 LAB — HIV ANTIBODY (ROUTINE TESTING W REFLEX): HIV 1&2 Ab, 4th Generation: NONREACTIVE

## 2014-02-19 MED ORDER — WITCH HAZEL-GLYCERIN EX PADS: 1.0000 "application " | MEDICATED_PAD | CUTANEOUS | Status: DC | PRN

## 2014-02-19 MED ORDER — IBUPROFEN 600 MG PO TABS
600.0000 mg | ORAL_TABLET | Freq: Four times a day (QID) | ORAL | Status: DC
  Administered 2014-02-19 – 2014-02-21 (×8): 600 mg via ORAL
  Filled 2014-02-19 (×8): qty 1

## 2014-02-19 MED ORDER — ONDANSETRON HCL 4 MG/2ML IJ SOLN: 4.0000 mg | Freq: Four times a day (QID) | INTRAMUSCULAR | Status: DC | PRN

## 2014-02-19 MED ORDER — ACETAMINOPHEN 325 MG PO TABS: 650.0000 mg | ORAL_TABLET | ORAL | Status: DC | PRN

## 2014-02-19 MED ORDER — ZOLPIDEM TARTRATE 5 MG PO TABS: 5.0000 mg | ORAL_TABLET | Freq: Every evening | ORAL | Status: DC | PRN

## 2014-02-19 MED ORDER — BENZOCAINE-MENTHOL 20-0.5 % EX AERO: 1.0000 "application " | INHALATION_SPRAY | CUTANEOUS | Status: DC | PRN

## 2014-02-19 MED ORDER — DIBUCAINE 1 % RE OINT: 1.0000 "application " | TOPICAL_OINTMENT | RECTAL | Status: DC | PRN

## 2014-02-19 MED ORDER — OXYCODONE-ACETAMINOPHEN 5-325 MG PO TABS: 2.0000 | ORAL_TABLET | ORAL | Status: DC | PRN

## 2014-02-19 MED ORDER — TETANUS-DIPHTH-ACELL PERTUSSIS 5-2.5-18.5 LF-MCG/0.5 IM SUSP: 0.5000 mL | Freq: Once | INTRAMUSCULAR | Status: DC

## 2014-02-19 MED ORDER — OXYCODONE-ACETAMINOPHEN 5-325 MG PO TABS: 1.0000 | ORAL_TABLET | ORAL | Status: DC | PRN

## 2014-02-19 MED ORDER — OXYTOCIN 40 UNITS IN LACTATED RINGERS INFUSION - SIMPLE MED
62.5000 mL/h | INTRAVENOUS | Status: DC
  Filled 2014-02-19: qty 1000

## 2014-02-19 MED ORDER — CITRIC ACID-SODIUM CITRATE 334-500 MG/5ML PO SOLN: 30.0000 mL | ORAL | Status: DC | PRN

## 2014-02-19 MED ORDER — SENNOSIDES-DOCUSATE SODIUM 8.6-50 MG PO TABS
2.0000 | ORAL_TABLET | ORAL | Status: DC
  Administered 2014-02-20 – 2014-02-21 (×2): 2 via ORAL
  Filled 2014-02-19 (×2): qty 2

## 2014-02-19 MED ORDER — DIPHENHYDRAMINE HCL 25 MG PO CAPS: 25.0000 mg | ORAL_CAPSULE | Freq: Four times a day (QID) | ORAL | Status: DC | PRN

## 2014-02-19 MED ORDER — ONDANSETRON HCL 4 MG PO TABS: 4.0000 mg | ORAL_TABLET | ORAL | Status: DC | PRN

## 2014-02-19 MED ORDER — LACTATED RINGERS IV SOLN
INTRAVENOUS | Status: DC
  Administered 2014-02-19: 13:00:00 via INTRAVENOUS

## 2014-02-19 MED ORDER — PRENATAL MULTIVITAMIN CH: 1.0000 | ORAL_TABLET | Freq: Every day | ORAL | Status: DC

## 2014-02-19 MED ORDER — OXYTOCIN BOLUS FROM INFUSION
500.0000 mL | INTRAVENOUS | Status: DC
  Administered 2014-02-19: 500 mL via INTRAVENOUS

## 2014-02-19 MED ORDER — ONDANSETRON HCL 4 MG/2ML IJ SOLN: 4.0000 mg | INTRAMUSCULAR | Status: DC | PRN

## 2014-02-19 MED ORDER — OXYCODONE-ACETAMINOPHEN 5-325 MG PO TABS
1.0000 | ORAL_TABLET | ORAL | Status: DC | PRN
  Administered 2014-02-19: 1 via ORAL
  Filled 2014-02-19: qty 1

## 2014-02-19 MED ORDER — SIMETHICONE 80 MG PO CHEW: 80.0000 mg | CHEWABLE_TABLET | ORAL | Status: DC | PRN

## 2014-02-19 MED ORDER — LIDOCAINE HCL (PF) 1 % IJ SOLN
30.0000 mL | INTRAMUSCULAR | Status: AC | PRN
  Administered 2014-02-19: 30 mL via SUBCUTANEOUS
  Filled 2014-02-19: qty 30

## 2014-02-19 MED ORDER — LACTATED RINGERS IV SOLN: 500.0000 mL | INTRAVENOUS | Status: DC | PRN

## 2014-02-19 MED ORDER — PRENATAL MULTIVITAMIN CH
1.0000 | ORAL_TABLET | Freq: Every day | ORAL | Status: DC
  Administered 2014-02-20 – 2014-02-21 (×2): 1 via ORAL
  Filled 2014-02-19 (×2): qty 1

## 2014-02-19 MED ORDER — LANOLIN HYDROUS EX OINT: TOPICAL_OINTMENT | CUTANEOUS | Status: DC | PRN

## 2014-02-19 NOTE — MAU Note (Signed)
[facility] and strip reviewed. Will go to 164

## 2014-02-19 NOTE — Progress Notes (Signed)
Notified of recheck of cervix. Unable to tell presentation because of bag of waters. Will come see pt

## 2014-02-19 NOTE — H&P (Signed)
History and Physical Note  Assessment/Plan:  Patient Active Problem List   Diagnosis Date Noted  . Active labor 02/19/2014    This is a 28 y.o. old [id number] at 3317w0d here for atcive labor # Fetal Assessement: Cat I, reassuring # Labor plan: Expectant management # Plans for coping with Labor: No pain control # Plans for infant feeding: plans to breastfeed, plans to bottle feed # Plans for post partum contraception: IUD # GBS: negative  The plan was discussed with the attending.  HPI:   Miranda Sosa is a 28 y.o. old here for Contractions  Chief Complaint  Patient presents with  . Labor Eval   Pt with contractions since this AM. Strong and frequent. Feel like labor pains.   YSA:[id number]:[facility]  Prenatal course: H/o LGA LTC Elevated 1 hour GTT, 1 elevated level on 3 hr GTT  Contractions started:  Yesterday Leaking fluid: no  Vaginal bleeding: no Fetal Movement: yes Accompanied by: FOB  Allergies  Review of patient's allergies indicates no known allergies.  Medications  Prior to Admission medications   Medication Sig Start Date End Date Taking? Authorizing Provider  Prenatal Vit-Fe Fumarate-FA (PRENATAL MULTIVITAMIN) TABS tablet Take 1 tablet by mouth daily at 12 noon.   Yes Historical Provider, MD    OB History  OB History    Gravida Para Term Preterm AB TAB SAB Ectopic Multiple Living   6 4 4  1  1   4       GYN History  History of abnormal pap? no History of STIs? no History of GYN surgery? no  Past Medical History  Past Medical History  Diagnosis Date  . History of cholecystectomy     Past Surgical History  Past Surgical History  Procedure Laterality Date  . Dilation and curettage of uterus      Past Social History  History   Social History  . Marital Status: Single    Spouse Name: N/A    Number of Children: N/A  . Years of Education: N/A   Social History Main Topics  . Smoking status: Never Smoker   . Smokeless tobacco:  Never Used  . Alcohol Use: No  . Drug Use: No  . Sexual Activity: Yes   Other Topics Concern  . None   Social History Narrative  . None    Family History  family history is not on file.  Review of Systems  Per HPI  Objective Blood pressure 122/67, pulse 102, temperature 98 F (36.7 C), temperature source Oral, resp. rate 18, weight 168 lb (76.204 kg).  General Appearance No acute distress, well appearing and well nourished.  Eyes Pupils equal and round. Extraocular muscles intact and sclera clear.  ENT Well hydrated moist mucous membranes  Neck Supple without any enlargements, no thyromegaly, or JVD.  Pulmonary Clear to auscultation bilaterally, without wheezes/crackles/rhonchi. Good air movement.  Cardiovascular Pulse normal rate, regularity and rhythm. S1 and S2 normal, No MRG  Abdomen Normoactive bowel sounds, abdomen soft, gravid, no fundal tenderness, no rebound or guarding.  Genitourinary normal external genitalia, vulva, vagina, cervix, uterus and adnexa  SVE Dilation: 5, Effacement (%): 90, Station: -2  Extremities No rash, lesions or petechiae. No bilateral cyanosis, clubbing.  Edema Trace  Skin Skin color, texture, turgor normal, no rash/lesions/breakdown  Neurologic Alert and oriented to person, place, and time. Cranial nerves II-XII grossly intact, normal gait. DTR 2+  Psychiatric Mood and affect within normal limits   FHR Monitoring Baseline Rate (A): 130  bpm   Accelerations: 15 x 15  Position: VTX, confirmed by US  Lab Results  Component Value Date   ABORH O POS 02/23/2010   LABRPR Nonreactive 10/19/2013   HGB 11.6* 06/26/2010   HCT 35.4* 06/26/2010    Imaging: Normal anatomy scan

## 2014-02-19 NOTE — MAU Note (Signed)
[facility] contractions every 3-4 minutes. States she was 3-4 cm in the office. Denies vaginal bleeding or discharge or leaking of fluid. Reports good fetal movement.

## 2014-02-19 NOTE — Progress Notes (Signed)
Notified of pt arrival in MAU and exam. Will watch for 1 hour and recheck

## 2014-02-20 LAB — CBC
HEMATOCRIT: 34 % — AB (ref 36.0–46.0)
Hemoglobin: 11.4 g/dL — ABNORMAL LOW (ref 12.0–15.0)
MCH: 28.9 pg (ref 26.0–34.0)
MCHC: 33.5 g/dL (ref 30.0–36.0)
MCV: 86.3 fL (ref 78.0–100.0)
Platelets: 202 10*3/uL (ref 150–400)
RBC: 3.94 MIL/uL (ref 3.87–5.11)
RDW: 14.1 % (ref 11.5–15.5)
WBC: 10.2 10*3/uL (ref 4.0–10.5)

## 2014-02-20 NOTE — Progress Notes (Signed)
Post Partum Day 1 Subjective:  Miranda Sosa is a 28 y.o. [id number] 6440w0d s/p NSVD.  No acute events overnight.  Pt denies problems with ambulating, voiding or po intake.  She denies nausea or vomiting.  Pain is well controlled.  She has had flatus. She has not had bowel movement.  Lochia Small.  Plan for birth control is IUD.  Method of Feeding: Breast + Bottle  Objective: Blood pressure 108/64, pulse 56, temperature 98.1 F (36.7 C), temperature source Oral, resp. rate 18, height 5' (1.524 m), weight 168 lb (76.204 kg), unknown if currently breastfeeding.  Physical Exam:  General: alert, cooperative and no distress Lochia:normal flow Chest: CTAB Heart: RRR no m/r/g Abdomen: +BS, soft, nontender,  Uterine Fundus: firm, below umbilicus DVT Evaluation: No evidence of DVT seen on physical exam. Extremities: no edema   Recent Labs  02/19/14 1300 02/20/14 0610  HGB 11.7* 11.4*  HCT 35.4* 34.0*    Assessment/Plan:  ASSESSMENT: Miranda Sosa is a 28 y.o. [id number] 5140w0d s/p NSVD  Plan for discharge tomorrow, Breastfeeding and Contraception IUD   LOS: 1 day   Elita BooneRoberts, Brityn Mastrogiovanni C 02/20/2014, 8:27 AM

## 2014-02-20 NOTE — Lactation Note (Signed)
This note was copied from the chart of Miranda Catheryn Mecott-Elorza. Lactation Consultation Note:  Initial visit with this experienced BF mom. Asked if they need interpreter and they said no. Baby alert and rooting in bassinet. Suggested latching baby on as she is showing feeding cues. Mom states baby is hungry all the time. Assisted mom with latch. Mom easily latched baby on by herself. I just added pillows under her arm. Encouraged to feed whenever baby is showing feeding cues. Mom reports she has no milk- reassured she does have small amount. She reports she can express it herself. Reviewed supply and demand. BF brochure in Spanish given to mom. No questions at present.   Patient Name: Miranda Sosa's Date: 02/20/2014 Reason for consult: Initial assessment   Maternal Data Formula Feeding for Exclusion: Yes Reason for exclusion: Mother's choice to formula feed on admision;Mother's choice to formula and breast feed on admission Does the patient have breastfeeding experience prior to this delivery?: Yes  Feeding Feeding Type: Breast Fed  LATCH Score/Interventions Latch: Grasps breast easily, tongue down, lips flanged, rhythmical sucking.  Audible Swallowing: A few with stimulation  Type of Nipple: Everted at rest and after stimulation  Comfort (Breast/Nipple): Soft / non-tender     Hold (Positioning): No assistance needed to correctly position infant at breast. Intervention(s): Support Pillows  LATCH Score: 9  Lactation Tools Discussed/Used     Consult Status Consult Status: PRN    Pamelia HoitWeeks, Miranda Sosa D 02/20/2014, 10:02 AM

## 2014-02-20 NOTE — Progress Notes (Signed)
UR chart review completed.  

## 2014-02-20 NOTE — Progress Notes (Signed)
I stopped by patient's room to check her needs.by Orlan LeavensViria Alvarez Interpreter.

## 2014-02-21 MED ORDER — IBUPROFEN 600 MG PO TABS: 600.0000 mg | ORAL_TABLET | Freq: Four times a day (QID) | ORAL | Status: AC

## 2014-02-21 NOTE — Discharge Instructions (Signed)

## 2014-02-21 NOTE — Progress Notes (Signed)
I assisted FP with questions.  Eda H Royal Interpreter. °

## 2014-02-21 NOTE — Plan of Care (Signed)
Problem: Discharge Progression Outcomes Goal: Barriers To Progression Addressed/Resolved Outcome: Completed/Met Date Met:  02/21/14 Goal: Tolerating diet Outcome: Completed/Met Date Met:  40/76/80 Goal: Complications resolved/controlled Outcome: Completed/Met Date Met:  02/21/14 Goal: Pain controlled with appropriate interventions Outcome: Completed/Met Date Met:  02/21/14 Goal: Remove staples per MD order Outcome: Not Applicable Date Met:  88/11/03 Goal: MMR given as ordered Outcome: Not Applicable Date Met:  15/94/58

## 2014-02-21 NOTE — Lactation Note (Signed)
This note was copied from the chart of Miranda Sosa. Lactation Consultation Note  Angeline is feeding often but is not satisfied.  She has not had a stool in over 36 hours.  She attaches to the breast and quickly falls asleep.  I assisted mom with a deeper latch which elicited a bit more suckling.  Tongue exercises were done and baby suckled well on a gloved finger inserted deeply into her mouth.  She has difficulty flanging her upper lip and when she elevates her tongue a frenum is noted but not easily (submucosal?).  I encouraged  Mom to use a very deep latch and add breast compressions. Baby fed better after this.   A manual pump was initiated to stimulate the breast and spoon feeding was taught.  Dr Ave Filterhandler notified. Follow-up as needed. Patient Name: Miranda Doreatha Massedzucena Sosa VHQIO'NToday's Date: 02/21/2014 Reason for consult: Follow-up assessment   Maternal Data Has patient been taught Hand Expression?: Yes  Feeding Feeding Type: Breast Fed Length of feed: 15 min  LATCH Score/Interventions Latch: Repeated attempts needed to sustain latch, nipple held in mouth throughout feeding, stimulation needed to elicit sucking reflex.  Audible Swallowing: A few with stimulation  Type of Nipple: Everted at rest and after stimulation  Comfort (Breast/Nipple): Soft / non-tender     Hold (Positioning): Assistance needed to correctly position infant at breast and maintain latch.  LATCH Score: 7  Lactation Tools Discussed/Used Pump Review: Milk Storage;Setup, frequency, and cleaning Date initiated:: 02/21/14   Consult Status      Soyla DryerJoseph, Janelie Goltz 02/21/2014, 10:08 AM

## 2014-02-21 NOTE — Discharge Summary (Signed)
Obstetric Discharge Summary Reason for Admission: onset of labor Prenatal Procedures: none Intrapartum Procedures: spontaneous vaginal delivery Postpartum Procedures: none Complications-Operative and Postpartum: second degree degree perineal laceration  Hospital Course:  Patient was admitted in active labor. She progressed quickly without complications.   Delivery Note At 4:00 PM a viable female was delivered via Vaginal, Spontaneous Delivery (Presentation: ; ). APGAR: , ; weight .  Placenta status: Intact, Spontaneous. Cord: 3 vessels with the following complications: None. Cord pH: not obtained  Anesthesia: None Episiotomy: None Lacerations: 2nd Suture Repair: 3.0 vicryl rapide Est. Blood Loss (mL): 300 cc  Mom to postpartum. Baby to Couplet care / Skin to Skin.  Elita BooneRoberts, Caroline C 02/19/2014, 4:26 PM   Postpartum, the patient did well with no complications.  On the day of discharge,  Pt denied problems with ambulating, voiding or po intake.  She denied nausea or vomiting.  Pain wass well controlled.  She has had flatus. She has not had bowel movement.  Lochia Small.  Plan for birth control is IUD.    H/H: Lab Results  Component Value Date/Time   HGB 11.4* 02/20/2014 06:10 AM   HCT 34.0* 02/20/2014 06:10 AM    Filed Vitals:   02/20/14 0616  BP: 108/64  Pulse: 56  Temp: 98.2 F (36.8 C)  Resp: 18    Physical Exam: VSS NAD Abd: Appropriately tender, ND, Fundus firm, below umbilicus No c/c/e, Neg homan's sign, neg cords Lochia Appropriate  Discharge Diagnoses: Term Pregnancy-delivered  Discharge Information: Date: 02/21/2014 Activity: pelvic rest Diet: routine  Medications: PNV and Ibuprofen Breast feeding:  Yes Condition: stable Instructions: refer to handout Discharge to: home      Medication List    TAKE these medications        ibuprofen 600 MG tablet  Commonly known as:  ADVIL,MOTRIN  Take 1 tablet (600 mg total) by mouth  every 6 (six) hours.     prenatal multivitamin Tabs tablet  Take 1 tablet by mouth daily at 12 noon.           Follow-up Information    [facility] [place] Schedule an appointment as soon as possible for a visit in 6 weeks.   Why:  for postpartum follow up   Contact information:   [address] [facility] [place] [id number] [phone]      Jacquiline DoeParker, Destane Speas 02/21/2014,7:34 AM

## 2016-01-04 IMAGING — US US OB COMP +14 WK
1 series · 12 of 28 positions shown · non-contrast
Comparison: none

[Series 1: us ob comp +14 wk · 96 acquisitions, 12 frames shown]
[im 4/96]
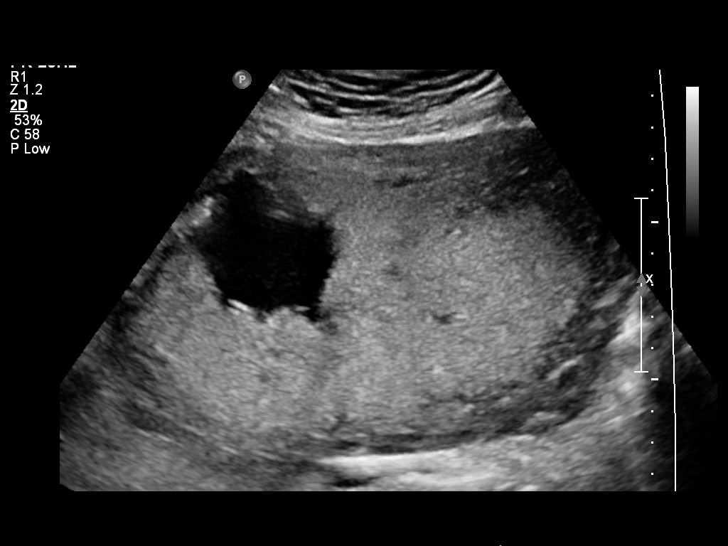
[im 11/96]
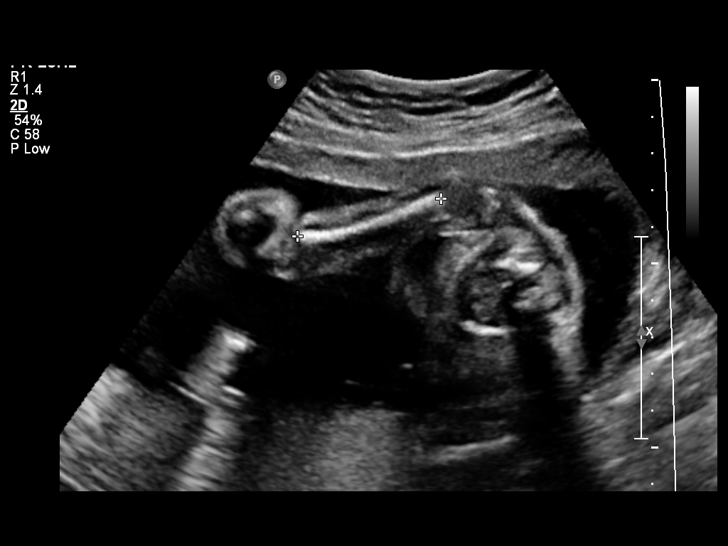
[im 18/96]
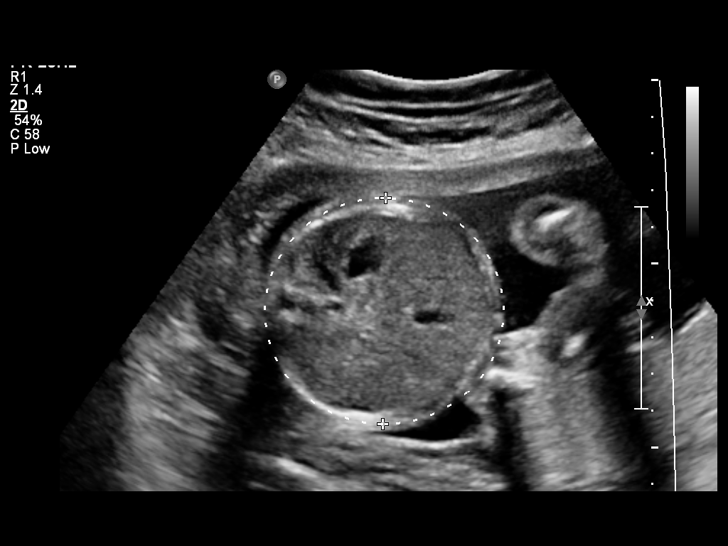
[im 29/96]
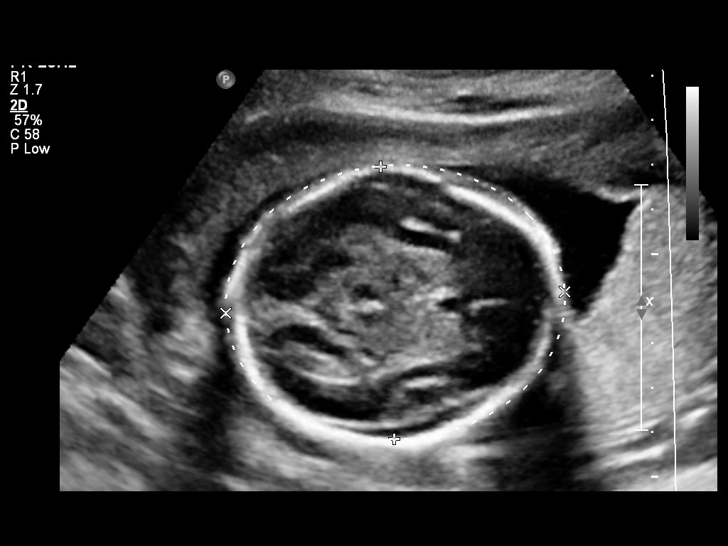
[im 36/96]
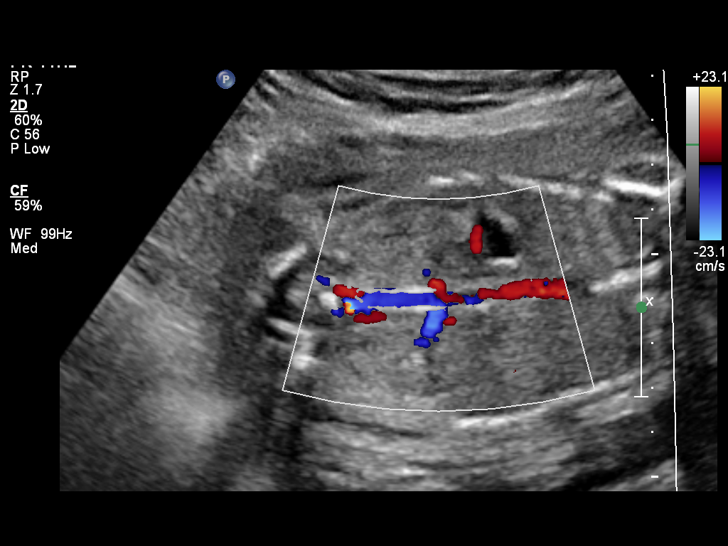
[im 43/96]
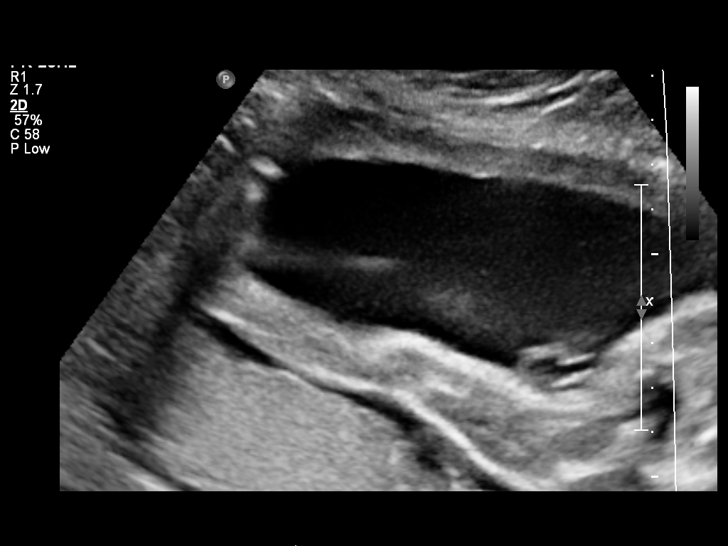
[im 53/96]
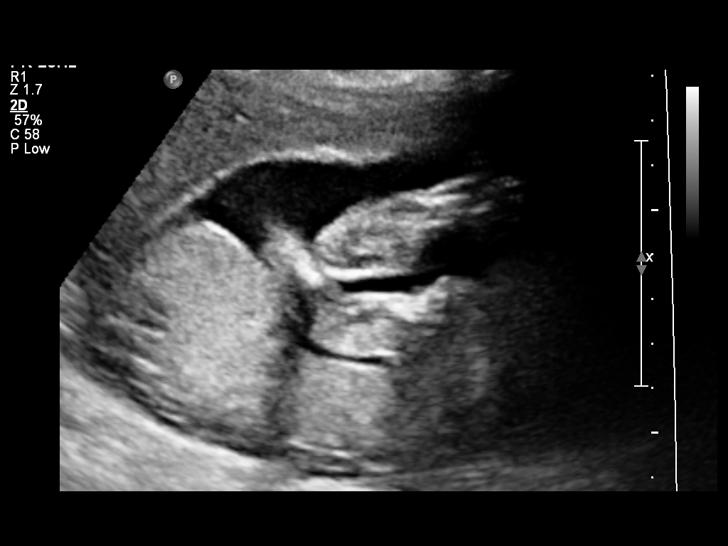
[im 60/96]
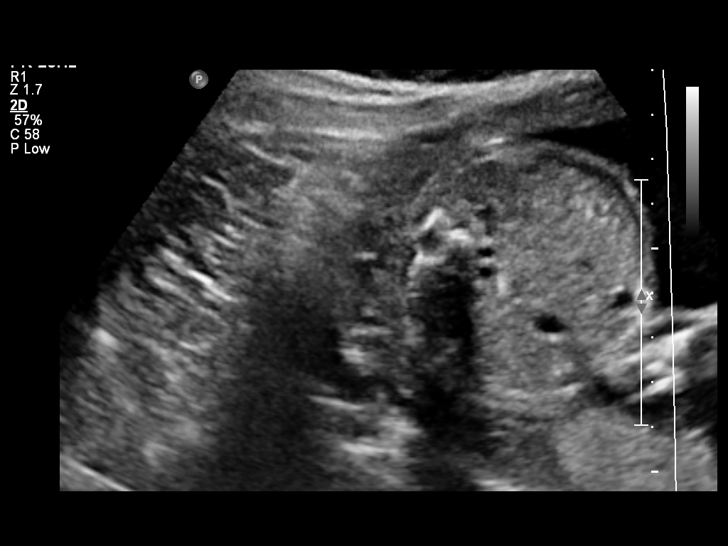
[im 67/96]
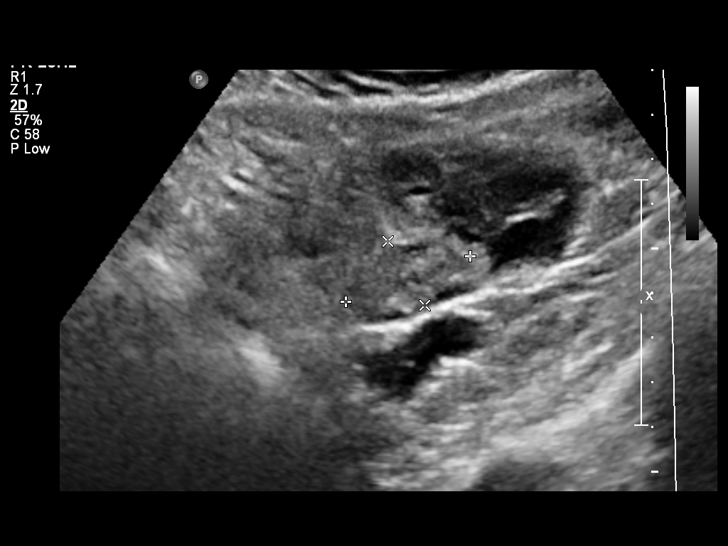
[im 78/96]
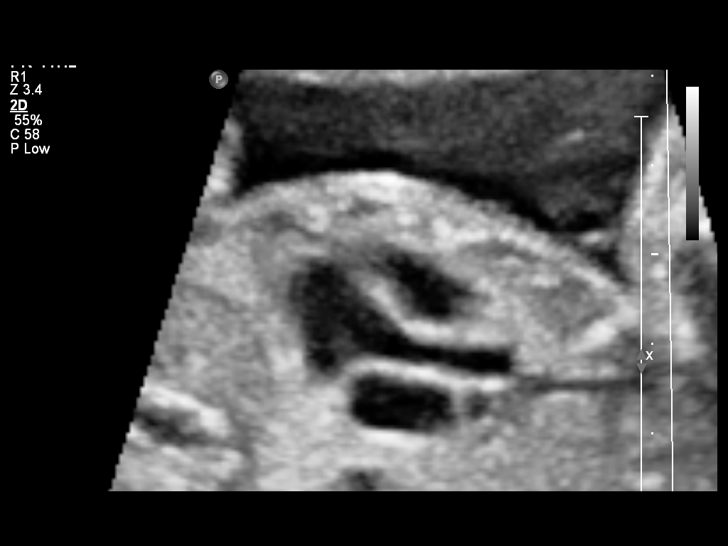
[im 85/96]
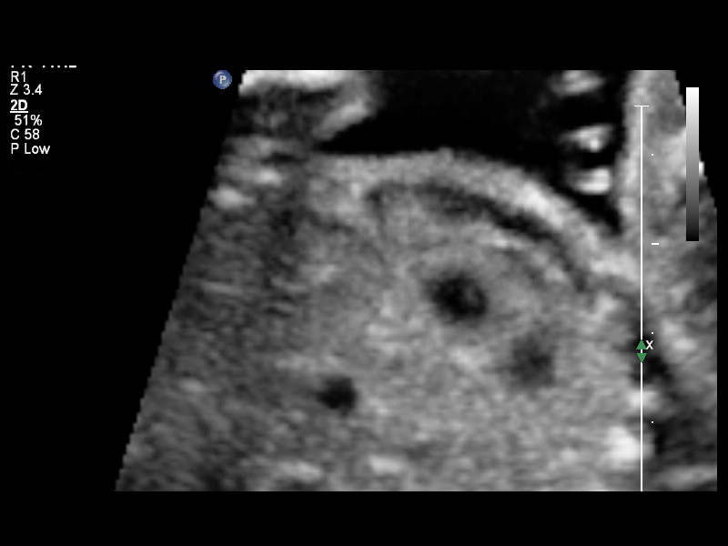
[im 92/96]
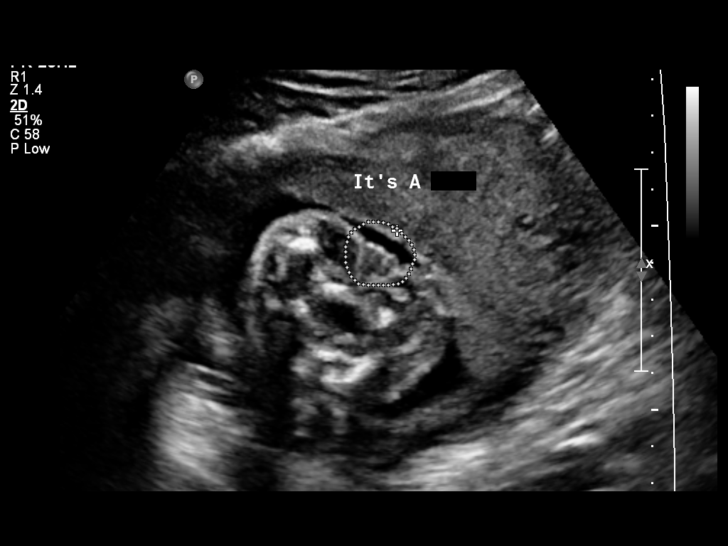

[12 of 28 positions shown; findings below may reference images not displayed]

OBSTETRICS REPORT
                      (Signed Final 10/23/2013 [DATE])

             ZULHAIMI

Service(s) Provided

 US OB COMP + 14 WK                                    76805.1
Indications

 Basic anatomic survey
Fetal Evaluation

 Num Of Fetuses:    1
 Fetal Heart Rate:  146                          bpm
 Cardiac Activity:  Observed
 Presentation:      Cephalic
 Placenta:          Posterior, above cervical
                    os
 P. Cord            Not well visualized
 Insertion:

 Amniotic Fluid
 AFI FV:      Subjectively within normal limits
                                             Larg Pckt:     6.1  cm
Biometry

 BPD:     60.9  mm     G. Age:  24w 5d                CI:        80.42   70 - 86
                                                      FL/HC:      19.9   19.2 -

 HC:     214.5  mm     G. Age:  23w 4d       50  %    HC/AC:      1.09   1.05 -

 AC:     196.8  mm     G. Age:  24w 3d       78  %    FL/BPD:     70.1   71 - 87
 FL:      42.7  mm     G. Age:  24w 0d       65  %    FL/AC:      21.7   20 - 24
 HUM:     40.4  mm     G. Age:  24w 4d       74  %
 CER:     25.3  mm     G. Age:  23w 2d       52  %

 Est. FW:     665  gm      1 lb 7 oz     69  %
Gestational Age

 LMP:           23w 1d        Date:  05/14/13                 EDD:   02/18/14
 U/S Today:     24w 1d                                        EDD:   02/11/14
 Best:          23w 1d     Det. By:  LMP  (05/14/13)          EDD:   02/18/14
Anatomy
 Cranium:          Appears normal         Aortic Arch:      Not well visualized
 Fetal Cavum:      Appears normal         Ductal Arch:      Not well visualized
 Ventricles:       Appears normal         Diaphragm:        Appears normal
 Choroid Plexus:   Appears normal         Stomach:          Appears normal, left
                                                            sided
 Cerebellum:       Appears normal         Abdomen:          Appears normal
 Posterior Fossa:  Appears normal         Abdominal Wall:   Appears nml (cord
                                                            insert, abd wall)
 Nuchal Fold:      Not applicable (>20    Cord Vessels:     Appears normal (3
                   wks GA)                                  vessel cord)
 Face:             Appears normal         Kidneys:          Appear normal
                   (orbits and profile)
 Lips:             Appears normal         Bladder:          Appears normal
 Heart:            Appears normal         Spine:            Appears normal
                   (4CH, axis, and
                   situs)
 RVOT:             Appears normal         Lower             Appears normal
                                          Extremities:
 LVOT:             Appears normal         Upper             Appears normal
                                          Extremities:

 Other:  Nasal bone visualized. Female gender. Technically difficult due to
         fetal position.
Targeted Anatomy

 Fetal Central Nervous System
 Lat. Ventricles:  3.7                    Cisterna Magna:
Cervix Uterus Adnexa

 Cervical Length:    5        cm

 Cervix:       Normal appearance by transabdominal scan.
 Left Ovary:    Within normal limits.
 Right Ovary:   Within normal limits.
Impression

 SIUP at 28w2d (remote read)
 EFW 69th%
 No dysmorphic features albeit views of cardiac arches are
 limited.
 No previa
Recommendations

 Recommend follow up attempt to complete fetal survey in 6-8
 weeks.

## 2021-12-25 ENCOUNTER — Other Ambulatory Visit: Payer: Self-pay

## 2021-12-25 DIAGNOSIS — N6310 Unspecified lump in the right breast, unspecified quadrant: Secondary | ICD-10-CM

## 2022-02-17 ENCOUNTER — Other Ambulatory Visit: Payer: Self-pay | Admitting: Obstetrics and Gynecology

## 2022-02-17 ENCOUNTER — Ambulatory Visit
Admission: RE | Admit: 2022-02-17 | Discharge: 2022-02-17 | Disposition: A | Discharge status: Home / Self Care | Payer: Self-pay | Source: Ambulatory Visit | Attending: Obstetrics and Gynecology | Admitting: Obstetrics and Gynecology

## 2022-02-17 ENCOUNTER — Ambulatory Visit
Admission: RE | Admit: 2022-02-17 | Discharge: 2022-02-17 | Disposition: A | Discharge status: Home / Self Care | Payer: No Typology Code available for payment source | Source: Ambulatory Visit | Attending: Obstetrics and Gynecology | Admitting: Obstetrics and Gynecology

## 2022-02-17 ENCOUNTER — Ambulatory Visit: Payer: Self-pay | Admitting: *Deleted

## 2022-02-17 VITALS — BP 110/80 | Wt 175.5 lb

## 2022-02-17 DIAGNOSIS — Z1239 Encounter for other screening for malignant neoplasm of breast: Secondary | ICD-10-CM

## 2022-02-17 DIAGNOSIS — N6489 Other specified disorders of breast: Secondary | ICD-10-CM

## 2022-02-17 DIAGNOSIS — N6310 Unspecified lump in the right breast, unspecified quadrant: Secondary | ICD-10-CM

## 2022-02-17 DIAGNOSIS — N644 Mastodynia: Secondary | ICD-10-CM

## 2022-02-17 NOTE — Patient Instructions (Signed)
Explained breast self awareness with Miranda Sosa. Patient did not need a Pap smear today due to last Pap smear and HPV typing was 03/21/2021. Let her know BCCCP will cover Pap smears and HPV typing every 5 years unless has a history of abnormal Pap smears. Referred patient to the [facility] for a diagnostic mammogram. Appointment scheduled Tuesday, February 17, 2022 at 1400. Patient aware of appointment and will be there. Miranda Sosa verbalized understanding.  Miranda Sosa, Arvil Chaco, RN 12:51 PM

## 2022-02-17 NOTE — Progress Notes (Signed)
[name] is a 36 y.o. female who presents to [facility] clinic today with complaint of right breast lump x one year that comes and goes. Patient stated she is not able to feel it today and that it doesn't correlate with her menstrual period. Patient complained of right burning breast pain x one year that comes and goes. Patient rates the pain at a 4 out of 10.    Pap Smear: Pap smear not completed today. Last Pap smear was 03/21/2021 at the [facility] clinic and was normal with negative HPV. Per patient has no history of an abnormal Pap smear. Last Pap smear result is available in Epic.   Physical exam: Breasts Breasts symmetrical. No skin abnormalities bilateral breasts. No nipple retraction bilateral breasts. No nipple discharge bilateral breasts. No lymphadenopathy. No lumps palpated bilateral breasts. Unable to palpate a lump within patients area of concern within the right breast. Complaints of right outer breast tenderness on exam.      Pelvic/Bimanual Pap is not indicated today per BCCCP guidelines.   Smoking History: Patient has never smoked.   Patient Navigation: Patient education provided. Access to services provided for patient through [place] program. Spanish interpreter Miranda Sosa from [facility] provided.    Breast and Cervical Cancer Risk Assessment: Patient has family history of two paternal aunts having breast cancer. Patient has no known genetic mutations or history of radiation treatment to the chest before age 37. Patient does not have history of cervical dysplasia, immunocompromised, or DES exposure in-utero.  Risk Assessment     Risk Scores       02/17/2022   Last edited by: Loletta Parish, RN   5-year risk: 0.2 %   Lifetime risk: 5.7 %           A: BCCCP exam without pap smear Complaint of right breast lump and pain.  P: Referred patient to the [place] for a diagnostic mammogram. Appointment  scheduled Tuesday, February 17, 2022 at 1400.  Loletta Parish, RN 02/17/2022 12:51 PM

## 2022-08-19 ENCOUNTER — Other Ambulatory Visit: Payer: Self-pay

## 2022-09-03 ENCOUNTER — Ambulatory Visit
Admission: RE | Admit: 2022-09-03 | Discharge: 2022-09-03 | Disposition: A | Discharge status: Home / Self Care | Payer: No Typology Code available for payment source | Source: Ambulatory Visit | Attending: Obstetrics and Gynecology | Admitting: Obstetrics and Gynecology

## 2022-09-03 ENCOUNTER — Ambulatory Visit
Admission: RE | Admit: 2022-09-03 | Discharge: 2022-09-03 | Disposition: A | Discharge status: Home / Self Care | Payer: Self-pay | Source: Ambulatory Visit | Attending: Obstetrics and Gynecology | Admitting: Obstetrics and Gynecology

## 2022-09-03 DIAGNOSIS — N6310 Unspecified lump in the right breast, unspecified quadrant: Secondary | ICD-10-CM

## 2022-09-03 DIAGNOSIS — N6489 Other specified disorders of breast: Secondary | ICD-10-CM

## 2023-01-06 ENCOUNTER — Other Ambulatory Visit: Payer: Self-pay

## 2023-01-06 DIAGNOSIS — N6310 Unspecified lump in the right breast, unspecified quadrant: Secondary | ICD-10-CM

## 2023-03-18 ENCOUNTER — Ambulatory Visit: Payer: Self-pay | Admitting: Hematology and Oncology

## 2023-03-18 ENCOUNTER — Ambulatory Visit
Admission: RE | Admit: 2023-03-18 | Discharge: 2023-03-18 | Disposition: A | Discharge status: Home / Self Care | Payer: Self-pay | Source: Ambulatory Visit | Attending: Obstetrics and Gynecology | Admitting: Obstetrics and Gynecology

## 2023-03-18 VITALS — BP 115/69 | Wt 179.0 lb

## 2023-03-18 DIAGNOSIS — N6310 Unspecified lump in the right breast, unspecified quadrant: Secondary | ICD-10-CM

## 2023-03-18 NOTE — Patient Instructions (Signed)
Taught Miranda Sosa about self breast awareness and gave educational materials to take home. Patient did not need a Pap smear today due to last Pap smear was in 03/21/2021 per patient. Let her know BCCCP will cover Pap smears every 5 years unless has a history of abnormal Pap smears. Referred patient to the [facility] for diagnostic mammogram. Appointment scheduled for 03/18/2023. Patient aware of appointment and will be there. Let patient know will follow up with her within the next couple weeks with results. Miranda Sosa verbalized understanding.  Pascal Lux, NP 8:58 AM

## 2023-03-18 NOTE — Progress Notes (Signed)
[name] is a 37 y.o. female who presents to [facility] clinic today with no complaints. Follow up probably benign right breast mass.   Pap Smear: Pap not smear completed today. Last Pap smear was 03/21/2021 and was normal. Per patient has no history of an abnormal Pap smear. Last Pap smear result is available in Epic.   Physical exam: Breasts Breasts symmetrical. No skin abnormalities bilateral breasts. No nipple retraction bilateral breasts. No nipple discharge bilateral breasts. No lymphadenopathy. No lumps palpated bilateral breasts.     MM 3D DIAGNOSTIC MAMMOGRAM UNILATERAL RIGHT BREAST  Result Date: 09/03/2022 CLINICAL DATA:  37 year old female presenting for short-term follow-up of a probably benign right breast mass. EXAM: DIGITAL DIAGNOSTIC UNILATERAL RIGHT MAMMOGRAM WITH TOMOSYNTHESIS; ULTRASOUND RIGHT BREAST LIMITED TECHNIQUE: Right digital diagnostic mammography and breast tomosynthesis was performed.; Targeted ultrasound examination of the right breast was performed COMPARISON:  Previous exam(s). ACR Breast Density Category c: The breasts are heterogeneously dense, which may obscure small masses. FINDINGS: Mammogram: Right breast: There is a stable small oval circumscribed mass in the superior right breast best seen on MLO view. Spot 2D magnification views of the right breast were performed for calcifications in the outer right breast. These demonstrate layering on lateral view consistent with benign milk of calcium. There are no new suspicious findings in the right breast. Ultrasound: Targeted ultrasound is performed in the right breast at 12 o'clock 5 cm from the nipple demonstrating a cluster of cysts measuring 0.7 x 0.3 x 0.3 cm, previously measuring 0.6 x 0.3 x 0.3 cm. IMPRESSION: Stable probably benign right breast mass favored to represent a cluster of cysts. RECOMMENDATION: Diagnostic bilateral mammogram and right breast ultrasound in 6 months. I have discussed the findings  and recommendations with the patient. If applicable, a reminder letter will be sent to the patient regarding the next appointment. BI-RADS CATEGORY  3: Probably benign. Electronically Signed   By: Emmaline Kluver M.D.   On: 09/03/2022 11:30  MS DIGITAL DIAG TOMO BILAT  Result Date: 02/17/2022 CLINICAL DATA:  Patient describes a recent/intermittent RIGHT breast lump, but can not localize the lump today. EXAM: DIGITAL DIAGNOSTIC BILATERAL MAMMOGRAM WITH TOMOSYNTHESIS; ULTRASOUND RIGHT BREAST LIMITED TECHNIQUE: Bilateral digital diagnostic mammography and breast tomosynthesis was performed.; Targeted ultrasound examination of the right breast was performed COMPARISON:  None available. ACR Breast Density Category c: The breast tissue is heterogeneously dense, which may obscure small masses. FINDINGS: RIGHT breast diagnostic mammogram: Patient states that her recent/intermittent palpable area of concern is in the outer RIGHT breast. There is a corresponding asymmetry within the outer RIGHT breast, most suggestive of an [place] of normal dense fibroglandular tissue based on the additional spot compression and XCCL views obtained today. Ultrasound will be performed to ensure benignity. There is an oval circumscribed mass within the upper RIGHT breast, 12 o'clock axis region, measuring approximately 6 mm greatest dimension, corresponding as an incidental finding. LEFT breast diagnostic mammogram: There are no dominant masses, suspicious calcifications or secondary signs of malignancy within the LEFT breast. Targeted ultrasound is performed, evaluating the outer RIGHT breast as directed by the patient, showing only normal fibroglandular tissues and fat lobules throughout. No solid or cystic mass. There is a probably benign cluster of microcysts within the RIGHT breast at the 12 o'clock axis, 5 cm from the nipple, measuring 5 mm, corresponding to the incidental mammographic finding. IMPRESSION: 1. Probably benign cluster  of microcysts within the RIGHT breast at the 12 o'clock axis, 5 cm from the nipple,  measuring 5 mm, corresponding to the incidental mammographic finding. Recommend follow-up RIGHT breast diagnostic mammogram and ultrasound in 6 months to ensure stability. 2. Probably benign island of normal dense fibroglandular tissue within the outer RIGHT breast, corresponding to patient's palpable area of concern. Recommend attention to this area on the follow-up diagnostic mammogram in 6 months. RECOMMENDATION: RIGHT breast diagnostic mammogram and ultrasound in 6 months. I have discussed the findings and recommendations with the patient. If applicable, a reminder letter will be sent to the patient regarding the next appointment. BI-RADS CATEGORY  3: Probably benign. Electronically Signed   By: Bary Richard M.D.   On: 02/17/2022 15:46     Pelvic/Bimanual Pap is not indicated today    Smoking History: Patient has never smoked and was not referred to quit line.    Patient Navigation: Patient education provided. Access to services provided for patient through BCCCP program. [place] interpreter provided. No transportation provided   Colorectal Cancer Screening: Per patient has never had colonoscopy completed No complaints today.    Breast and Cervical Cancer Risk Assessment: Patient does not have family history of breast cancer, known genetic mutations, or radiation treatment to the chest before age 46. Patient does not have history of cervical dysplasia, immunocompromised, or DES exposure in-utero.  Risk Scores as of Encounter on 03/18/2023     Miranda Sosa           5-year 0.22%   Lifetime 6.03%            Last calculated by Caprice Red, CMA on 03/18/2023 at  8:34 AM        A: BCCCP exam without pap smear No complaints with benign exam. Follow up probable benign right breast mass.   P: Referred patient to the [facility] for a diagnostic mammogram. Appointment scheduled  03/18/2023.  Pascal Lux, NP 03/18/2023 8:53 AM

## 2024-03-16 ENCOUNTER — Other Ambulatory Visit: Payer: Self-pay

## 2024-03-16 DIAGNOSIS — N6310 Unspecified lump in the right breast, unspecified quadrant: Secondary | ICD-10-CM

## 2024-05-18 ENCOUNTER — Ambulatory Visit
Admission: RE | Admit: 2024-05-18 | Discharge: 2024-05-18 | Disposition: A | Payer: Self-pay | Source: Ambulatory Visit | Attending: Obstetrics and Gynecology | Admitting: Obstetrics and Gynecology

## 2024-05-18 ENCOUNTER — Ambulatory Visit: Payer: Self-pay | Admitting: *Deleted

## 2024-05-18 VITALS — BP 110/77 | Wt 178.8 lb

## 2024-05-18 DIAGNOSIS — Z1239 Encounter for other screening for malignant neoplasm of breast: Secondary | ICD-10-CM

## 2024-05-18 DIAGNOSIS — N6310 Unspecified lump in the right breast, unspecified quadrant: Secondary | ICD-10-CM

## 2024-05-18 NOTE — Patient Instructions (Signed)
 Explained breast self awareness with Miranda Sosa. Patient did not need a Pap smear today due to last Pap smear was in January 2026 per patient. Let her know BCCCP will cover Pap smears every 3 years unless has a history of abnormal Pap smears. Referred patient to the [facility] for a diagnostic mammogram per recommendation. Appointment scheduled Thursday, May 18, 2024 at 1520. Patient aware of appointment and will be there. Miranda Sosa verbalized understanding.  Miranda Sosa, Miranda Ship, RN 2:11 PM

## 2024-05-18 NOTE — Progress Notes (Signed)
 [name] is a 39 y.o. female who presents to [facility] clinic today with no complaints. Patients last diagnostic mammogram was 03/18/2023 and probably benign that a bilateral diagnostic mammogram was recommended for follow up.   Pap Smear: Pap smear not completed today. Last Pap smear was in January 2026 at the [facility] clinic and was normal per patient. Per patient has no history of an abnormal Pap smear. Last Pap smear result is not available in Epic. Previous Pap smear result from 03/21/2021 is in EPIC.   Physical exam: Breasts Breasts symmetrical. No skin abnormalities bilateral breasts. No nipple retraction bilateral breasts. No nipple discharge bilateral breasts. No lymphadenopathy. No lumps palpated bilateral breasts. No complaints of pain or tenderness on exam.     MS 3D DIAG MAMMO BILAT BR (aka MM) Result Date: 03/18/2023 CLINICAL DATA:  39 year old female presents for 1 year follow-up of RIGHT breast mass and for annual bilateral mammogram. EXAM: DIGITAL DIAGNOSTIC BILATERAL MAMMOGRAM WITH TOMOSYNTHESIS AND CAD; ULTRASOUND RIGHT BREAST LIMITED TECHNIQUE: Bilateral digital diagnostic mammography and breast tomosynthesis was performed. The images were evaluated with computer-aided detection. ; Targeted ultrasound examination of the right breast was performed COMPARISON:  Previous exam(s). ACR Breast Density Category c: The breasts are heterogeneously dense, which may obscure small masses. FINDINGS: Full field views of both breasts demonstrate no new or suspicious mammographic findings within either breast. Targeted ultrasound is performed, showing a stable 0.6 x 0.3 x 0.3 cm probable cluster of cyst at the 12 o'clock position of the RIGHT breast 5 cm from the nipple. IMPRESSION: 1. Stable likely benign mass/cluster of cysts in the UPPER RIGHT breast. One year follow-up recommended to ensure 2 year stability. RECOMMENDATION: Bilateral diagnostic mammogram and RIGHT  breast ultrasound in 1 year. I have discussed the findings and recommendations with the patient. If applicable, a reminder letter will be sent to the patient regarding the next appointment. BI-RADS CATEGORY  3: Probably benign. Electronically Signed   By: Reyes Phi M.D.   On: 03/18/2023 10:41   MM 3D DIAGNOSTIC MAMMOGRAM UNILATERAL RIGHT BREAST Result Date: 09/03/2022 CLINICAL DATA:  39 year old female presenting for short-term follow-up of a probably benign right breast mass. EXAM: DIGITAL DIAGNOSTIC UNILATERAL RIGHT MAMMOGRAM WITH TOMOSYNTHESIS; ULTRASOUND RIGHT BREAST LIMITED TECHNIQUE: Right digital diagnostic mammography and breast tomosynthesis was performed.; Targeted ultrasound examination of the right breast was performed COMPARISON:  Previous exam(s). ACR Breast Density Category c: The breasts are heterogeneously dense, which may obscure small masses. FINDINGS: Mammogram: Right breast: There is a stable small oval circumscribed mass in the superior right breast best seen on MLO view. Spot 2D magnification views of the right breast were performed for calcifications in the outer right breast. These demonstrate layering on lateral view consistent with benign milk of calcium. There are no new suspicious findings in the right breast. Ultrasound: Targeted ultrasound is performed in the right breast at 12 o'clock 5 cm from the nipple demonstrating a cluster of cysts measuring 0.7 x 0.3 x 0.3 cm, previously measuring 0.6 x 0.3 x 0.3 cm. IMPRESSION: Stable probably benign right breast mass favored to represent a cluster of cysts. RECOMMENDATION: Diagnostic bilateral mammogram and right breast ultrasound in 6 months. I have discussed the findings and recommendations with the patient. If applicable, a reminder letter will be sent to the patient regarding the next appointment. BI-RADS CATEGORY  3: Probably benign. Electronically Signed   By: Inocente Ast M.D.   On: 09/03/2022 11:30  MS DIGITAL DIAG TOMO  BILAT Result Date: 02/17/2022 CLINICAL DATA:  Patient describes a recent/intermittent RIGHT breast lump, but can not localize the lump today. EXAM: DIGITAL DIAGNOSTIC BILATERAL MAMMOGRAM WITH TOMOSYNTHESIS; ULTRASOUND RIGHT BREAST LIMITED TECHNIQUE: Bilateral digital diagnostic mammography and breast tomosynthesis was performed.; Targeted ultrasound examination of the right breast was performed COMPARISON:  None available. ACR Breast Density Category c: The breast tissue is heterogeneously dense, which may obscure small masses. FINDINGS: RIGHT breast diagnostic mammogram: Patient states that her recent/intermittent palpable area of concern is in the outer RIGHT breast. There is a corresponding asymmetry within the outer RIGHT breast, most suggestive of an [place] of normal dense fibroglandular tissue based on the additional spot compression and XCCL views obtained today. Ultrasound will be performed to ensure benignity. There is an oval circumscribed mass within the upper RIGHT breast, 12 o'clock axis region, measuring approximately 6 mm greatest dimension, corresponding as an incidental finding. LEFT breast diagnostic mammogram: There are no dominant masses, suspicious calcifications or secondary signs of malignancy within the LEFT breast. Targeted ultrasound is performed, evaluating the outer RIGHT breast as directed by the patient, showing only normal fibroglandular tissues and fat lobules throughout. No solid or cystic mass. There is a probably benign cluster of microcysts within the RIGHT breast at the 12 o'clock axis, 5 cm from the nipple, measuring 5 mm, corresponding to the incidental mammographic finding. IMPRESSION: 1. Probably benign cluster of microcysts within the RIGHT breast at the 12 o'clock axis, 5 cm from the nipple, measuring 5 mm, corresponding to the incidental mammographic finding. Recommend follow-up RIGHT breast diagnostic mammogram and ultrasound in 6 months to ensure stability. 2. Probably  benign island of normal dense fibroglandular tissue within the outer RIGHT breast, corresponding to patient's palpable area of concern. Recommend attention to this area on the follow-up diagnostic mammogram in 6 months. RECOMMENDATION: RIGHT breast diagnostic mammogram and ultrasound in 6 months. I have discussed the findings and recommendations with the patient. If applicable, a reminder letter will be sent to the patient regarding the next appointment. BI-RADS CATEGORY  3: Probably benign. Electronically Signed   By: Lael Hines M.D.   On: 02/17/2022 15:46   Pelvic/Bimanual Pap is not indicated today per BCCCP guidelines.   Smoking History: Patient has never smoked.   Patient Navigation: Patient education provided. Access to services provided for patient through [place] program. Spanish interpreter Miranda Sosa from [facility] provided.    Breast and Cervical Cancer Risk Assessment: Patient has family history of a paternal aunt having breast cancer. Patient has no known genetic mutations or history of radiation treatment to the chest before age 50. Patient does not have history of cervical dysplasia, immunocompromised, or DES exposure in-utero.  Risk Scores as of Encounter on 05/18/2024     Alisa           5-year 0.27%   Lifetime 5.99%            Last calculated by Logan Lyle BRAVO, CMA on 05/18/2024 at  2:00 PM        A: BCCCP exam without pap smear No complaints.  P: Referred patient to the [facility] for a diagnostic mammogram per recommendation. Appointment scheduled Thursday, May 18, 2024 at 1520.  Driscilla Wanda SQUIBB, RN 05/18/2024 2:11 PM
